# Patient Record
Sex: Female | Born: 1980 | Race: Black or African American | Hispanic: No | Marital: Single | Smoking: Never smoker
Health system: Southern US, Community
[De-identification: ages and names within clinical notes are randomized; demographics above are authoritative.]

## PROBLEM LIST (undated history)

## (undated) ENCOUNTER — Inpatient Hospital Stay (HOSPITAL_COMMUNITY): Payer: Self-pay

## (undated) DIAGNOSIS — M1990 Unspecified osteoarthritis, unspecified site: Secondary | ICD-10-CM

## (undated) DIAGNOSIS — E079 Disorder of thyroid, unspecified: Secondary | ICD-10-CM

## (undated) DIAGNOSIS — K219 Gastro-esophageal reflux disease without esophagitis: Secondary | ICD-10-CM

## (undated) DIAGNOSIS — O009 Unspecified ectopic pregnancy without intrauterine pregnancy: Secondary | ICD-10-CM

## (undated) DIAGNOSIS — I10 Essential (primary) hypertension: Secondary | ICD-10-CM

## (undated) HISTORY — DX: Gastro-esophageal reflux disease without esophagitis: K21.9

## (undated) HISTORY — PX: OTHER SURGICAL HISTORY: SHX169

## (undated) HISTORY — PX: LAPAROSCOPY: SHX197

---

## 2008-09-23 ENCOUNTER — Emergency Department (HOSPITAL_BASED_OUTPATIENT_CLINIC_OR_DEPARTMENT_OTHER): Admission: EM | Admit: 2008-09-23 | Discharge: 2008-09-23 | Payer: Self-pay | Admitting: Emergency Medicine

## 2009-01-23 ENCOUNTER — Emergency Department (HOSPITAL_BASED_OUTPATIENT_CLINIC_OR_DEPARTMENT_OTHER): Admission: EM | Admit: 2009-01-23 | Discharge: 2009-01-23 | Payer: Self-pay | Admitting: Emergency Medicine

## 2009-01-23 ENCOUNTER — Ambulatory Visit: Payer: Self-pay | Admitting: Diagnostic Radiology

## 2009-02-23 ENCOUNTER — Emergency Department (HOSPITAL_BASED_OUTPATIENT_CLINIC_OR_DEPARTMENT_OTHER): Admission: EM | Admit: 2009-02-23 | Discharge: 2009-02-23 | Payer: Self-pay | Admitting: Emergency Medicine

## 2009-03-03 ENCOUNTER — Emergency Department (HOSPITAL_BASED_OUTPATIENT_CLINIC_OR_DEPARTMENT_OTHER): Admission: EM | Admit: 2009-03-03 | Discharge: 2009-03-03 | Payer: Self-pay | Admitting: Emergency Medicine

## 2009-03-07 ENCOUNTER — Other Ambulatory Visit: Payer: Self-pay | Admitting: Emergency Medicine

## 2009-03-08 ENCOUNTER — Ambulatory Visit: Payer: Self-pay | Admitting: Advanced Practice Midwife

## 2009-03-08 ENCOUNTER — Inpatient Hospital Stay (HOSPITAL_COMMUNITY): Admission: AD | Admit: 2009-03-08 | Discharge: 2009-03-08 | Payer: Self-pay | Admitting: Obstetrics and Gynecology

## 2009-03-11 ENCOUNTER — Inpatient Hospital Stay (HOSPITAL_COMMUNITY): Admission: AD | Admit: 2009-03-11 | Discharge: 2009-03-11 | Payer: Self-pay | Admitting: Family Medicine

## 2009-03-14 ENCOUNTER — Inpatient Hospital Stay (HOSPITAL_COMMUNITY): Admission: AD | Admit: 2009-03-14 | Discharge: 2009-03-14 | Payer: Self-pay | Admitting: Obstetrics & Gynecology

## 2009-03-17 ENCOUNTER — Inpatient Hospital Stay (HOSPITAL_COMMUNITY): Admission: AD | Admit: 2009-03-17 | Discharge: 2009-03-17 | Payer: Self-pay | Admitting: Obstetrics and Gynecology

## 2009-03-17 ENCOUNTER — Ambulatory Visit: Payer: Self-pay | Admitting: Obstetrics and Gynecology

## 2009-03-21 ENCOUNTER — Inpatient Hospital Stay (HOSPITAL_COMMUNITY): Admission: AD | Admit: 2009-03-21 | Discharge: 2009-03-21 | Payer: Self-pay | Admitting: Obstetrics & Gynecology

## 2009-03-28 ENCOUNTER — Inpatient Hospital Stay (HOSPITAL_COMMUNITY): Admission: AD | Admit: 2009-03-28 | Discharge: 2009-03-28 | Payer: Self-pay | Admitting: Obstetrics and Gynecology

## 2009-04-11 ENCOUNTER — Inpatient Hospital Stay (HOSPITAL_COMMUNITY): Admission: AD | Admit: 2009-04-11 | Discharge: 2009-04-11 | Payer: Self-pay | Admitting: Obstetrics & Gynecology

## 2009-04-18 ENCOUNTER — Inpatient Hospital Stay (HOSPITAL_COMMUNITY): Admission: AD | Admit: 2009-04-18 | Discharge: 2009-04-18 | Payer: Self-pay | Admitting: Family Medicine

## 2009-06-08 ENCOUNTER — Emergency Department (HOSPITAL_BASED_OUTPATIENT_CLINIC_OR_DEPARTMENT_OTHER): Admission: EM | Admit: 2009-06-08 | Discharge: 2009-06-08 | Payer: Self-pay | Admitting: Emergency Medicine

## 2010-01-31 ENCOUNTER — Emergency Department (HOSPITAL_BASED_OUTPATIENT_CLINIC_OR_DEPARTMENT_OTHER)
Admission: EM | Admit: 2010-01-31 | Discharge: 2010-01-31 | Payer: Self-pay | Source: Home / Self Care | Admitting: Emergency Medicine

## 2010-03-04 ENCOUNTER — Emergency Department (HOSPITAL_BASED_OUTPATIENT_CLINIC_OR_DEPARTMENT_OTHER)
Admission: EM | Admit: 2010-03-04 | Discharge: 2010-03-05 | Payer: Self-pay | Source: Home / Self Care | Admitting: Emergency Medicine

## 2010-03-10 LAB — BASIC METABOLIC PANEL
BUN: 13 mg/dL (ref 6–23)
CO2: 26 mEq/L (ref 19–32)
Calcium: 9.7 mg/dL (ref 8.4–10.5)
Chloride: 105 mEq/L (ref 96–112)
Creatinine, Ser: 0.7 mg/dL (ref 0.4–1.2)
GFR calc Af Amer: >60 mL/min (ref >60)
GFR calc non Af Amer: >60 mL/min (ref >60)
Glucose, Bld: 101 mg/dL — ABNORMAL HIGH (ref 70–99)
Potassium: 3.8 mEq/L (ref 3.5–5.1)
Sodium: 143 mEq/L (ref 135–145)

## 2010-03-10 LAB — TSH: TSH: 2.474 u[IU]/mL (ref 0.350–4.500)

## 2010-03-10 LAB — HEMOCCULT GUIAC POC 1CARD (OFFICE): Fecal Occult Bld: NEGATIVE

## 2010-04-04 ENCOUNTER — Emergency Department (HOSPITAL_BASED_OUTPATIENT_CLINIC_OR_DEPARTMENT_OTHER)
Admission: EM | Admit: 2010-04-04 | Discharge: 2010-04-04 | Disposition: A | Discharge status: Home / Self Care | Payer: Self-pay | Attending: Emergency Medicine | Admitting: Emergency Medicine

## 2010-04-04 DIAGNOSIS — R350 Frequency of micturition: Secondary | ICD-10-CM | POA: Insufficient documentation

## 2010-04-04 DIAGNOSIS — I10 Essential (primary) hypertension: Secondary | ICD-10-CM | POA: Insufficient documentation

## 2010-04-04 DIAGNOSIS — N390 Urinary tract infection, site not specified: Secondary | ICD-10-CM | POA: Insufficient documentation

## 2010-04-04 LAB — URINALYSIS, ROUTINE W REFLEX MICROSCOPIC
Bilirubin Urine: NEGATIVE
Ketones, ur: NEGATIVE mg/dL
Nitrite: NEGATIVE
Protein, ur: NEGATIVE mg/dL
Specific Gravity, Urine: 1.009 (ref 1.005–1.030)
Urine Glucose, Fasting: NEGATIVE mg/dL
Urobilinogen, UA: 1 mg/dL (ref 0.0–1.0)
pH: 7 (ref 5.0–8.0)

## 2010-04-04 LAB — URINE MICROSCOPIC-ADD ON

## 2010-04-04 LAB — PREGNANCY, URINE: Preg Test, Ur: NEGATIVE

## 2010-05-06 LAB — URINALYSIS, ROUTINE W REFLEX MICROSCOPIC
Bilirubin Urine: NEGATIVE
Glucose, UA: NEGATIVE mg/dL
Hgb urine dipstick: NEGATIVE
Ketones, ur: NEGATIVE mg/dL
Nitrite: NEGATIVE
Protein, ur: NEGATIVE mg/dL
Specific Gravity, Urine: 1.016 (ref 1.005–1.030)
Urobilinogen, UA: 1 mg/dL (ref 0.0–1.0)
pH: 7.5 (ref 5.0–8.0)

## 2010-05-06 LAB — PREGNANCY, URINE: Preg Test, Ur: NEGATIVE

## 2010-05-11 LAB — BASIC METABOLIC PANEL
BUN: 8 mg/dL (ref 6–23)
BUN: 9 mg/dL (ref 6–23)
CO2: 27 mEq/L (ref 19–32)
CO2: 29 mEq/L (ref 19–32)
Calcium: 9 mg/dL (ref 8.4–10.5)
Calcium: 9 mg/dL (ref 8.4–10.5)
Chloride: 104 mEq/L (ref 96–112)
Chloride: 107 mEq/L (ref 96–112)
Creatinine, Ser: 0.7 mg/dL (ref 0.4–1.2)
Creatinine, Ser: 0.7 mg/dL (ref 0.4–1.2)
GFR calc Af Amer: >60 mL/min (ref >60)
GFR calc Af Amer: >60 mL/min (ref >60)
GFR calc non Af Amer: >60 mL/min (ref >60)
GFR calc non Af Amer: >60 mL/min (ref >60)
Glucose, Bld: 84 mg/dL (ref 70–99)
Glucose, Bld: 91 mg/dL (ref 70–99)
Potassium: 3.4 mEq/L — ABNORMAL LOW (ref 3.5–5.1)
Potassium: 4.5 mEq/L (ref 3.5–5.1)
Sodium: 143 mEq/L (ref 135–145)
Sodium: 143 mEq/L (ref 135–145)

## 2010-05-11 LAB — CBC
HCT: 31.3 % — ABNORMAL LOW (ref 36.0–46.0)
HCT: 32.7 % — ABNORMAL LOW (ref 36.0–46.0)
Hemoglobin: 10.8 g/dL — ABNORMAL LOW (ref 12.0–15.0)
Hemoglobin: 10.8 g/dL — ABNORMAL LOW (ref 12.0–15.0)
MCHC: 32.9 g/dL (ref 30.0–36.0)
MCHC: 34.6 g/dL (ref 30.0–36.0)
MCV: 88.6 fL (ref 78.0–100.0)
MCV: 89.3 fL (ref 78.0–100.0)
Platelets: 311 10*3/uL (ref 150–400)
Platelets: 328 10*3/uL (ref 150–400)
RBC: 3.53 MIL/uL — ABNORMAL LOW (ref 3.87–5.11)
RBC: 3.66 MIL/uL — ABNORMAL LOW (ref 3.87–5.11)
RDW: 12.5 % (ref 11.5–15.5)
RDW: 12.8 % (ref 11.5–15.5)
WBC: 4.8 10*3/uL (ref 4.0–10.5)
WBC: 7.7 10*3/uL (ref 4.0–10.5)

## 2010-05-11 LAB — COMPREHENSIVE METABOLIC PANEL
ALT: 12 U/L (ref 0–35)
AST: 17 U/L (ref 0–37)
CO2: 24 mEq/L (ref 19–32)
Calcium: 8.9 mg/dL (ref 8.4–10.5)
Chloride: 103 mEq/L (ref 96–112)
Creatinine, Ser: 0.5 mg/dL (ref 0.4–1.2)
GFR calc non Af Amer: >60 mL/min (ref >60)
Glucose, Bld: 89 mg/dL (ref 70–99)
Sodium: 133 mEq/L — ABNORMAL LOW (ref 135–145)
Total Bilirubin: 0.7 mg/dL (ref 0.3–1.2)

## 2010-05-11 LAB — GC/CHLAMYDIA PROBE AMP, GENITAL
Chlamydia, DNA Probe: NEGATIVE
GC Probe Amp, Genital: NEGATIVE

## 2010-05-11 LAB — URINALYSIS, ROUTINE W REFLEX MICROSCOPIC
Bilirubin Urine: NEGATIVE
Bilirubin Urine: NEGATIVE
Glucose, UA: NEGATIVE mg/dL
Glucose, UA: NEGATIVE mg/dL
Hgb urine dipstick: NEGATIVE
Ketones, ur: 15 mg/dL — AB
Ketones, ur: NEGATIVE mg/dL
Nitrite: NEGATIVE
Nitrite: NEGATIVE
Protein, ur: NEGATIVE mg/dL
Protein, ur: NEGATIVE mg/dL
Specific Gravity, Urine: 1.024 (ref 1.005–1.030)
Specific Gravity, Urine: 1.026 (ref 1.005–1.030)
Urobilinogen, UA: 1 mg/dL (ref 0.0–1.0)
Urobilinogen, UA: 4 mg/dL — ABNORMAL HIGH (ref 0.0–1.0)
pH: 6 (ref 5.0–8.0)
pH: 7 (ref 5.0–8.0)

## 2010-05-11 LAB — DIFFERENTIAL
Basophils Absolute: 0.1 10*3/uL (ref 0.0–0.1)
Basophils Absolute: 0.2 10*3/uL — ABNORMAL HIGH (ref 0.0–0.1)
Basophils Relative: 2 % — ABNORMAL HIGH (ref 0–1)
Basophils Relative: 2 % — ABNORMAL HIGH (ref 0–1)
Eosinophils Absolute: 0 10*3/uL (ref 0.0–0.7)
Eosinophils Absolute: 0 10*3/uL (ref 0.0–0.7)
Eosinophils Relative: 0 % (ref 0–5)
Eosinophils Relative: 1 % (ref 0–5)
Lymphocytes Relative: 19 % (ref 12–46)
Lymphocytes Relative: 23 % (ref 12–46)
Lymphs Abs: 1.1 10*3/uL (ref 0.7–4.0)
Lymphs Abs: 1.5 10*3/uL (ref 0.7–4.0)
Monocytes Absolute: 0.2 10*3/uL (ref 0.1–1.0)
Monocytes Absolute: 0.3 10*3/uL (ref 0.1–1.0)
Monocytes Relative: 4 % (ref 3–12)
Monocytes Relative: 4 % (ref 3–12)
Neutro Abs: 3.4 10*3/uL (ref 1.7–7.7)
Neutro Abs: 5.7 10*3/uL (ref 1.7–7.7)
Neutrophils Relative %: 71 % (ref 43–77)
Neutrophils Relative %: 74 % (ref 43–77)

## 2010-05-11 LAB — URINE MICROSCOPIC-ADD ON

## 2010-05-11 LAB — WET PREP, GENITAL
Trich, Wet Prep: NONE SEEN
Yeast Wet Prep HPF POC: NONE SEEN

## 2010-05-11 LAB — PREGNANCY, URINE
Preg Test, Ur: POSITIVE
Preg Test, Ur: POSITIVE

## 2010-05-11 LAB — HCG, QUANTITATIVE, PREGNANCY: hCG, Beta Chain, Quant, S: 8061 m[IU]/mL — ABNORMAL HIGH (ref <5)

## 2010-05-12 LAB — CREATININE, SERUM
Creatinine, Ser: 0.6 mg/dL (ref 0.4–1.2)
GFR calc Af Amer: >60 mL/min (ref >60)

## 2010-05-12 LAB — DIFFERENTIAL
Basophils Absolute: 0 10*3/uL (ref 0.0–0.1)
Basophils Relative: 0 % (ref 0–1)
Eosinophils Absolute: 0.1 10*3/uL (ref 0.0–0.7)
Monocytes Absolute: 0.3 10*3/uL (ref 0.1–1.0)
Neutro Abs: 5.3 10*3/uL (ref 1.7–7.7)
Neutrophils Relative %: 81 % — ABNORMAL HIGH (ref 43–77)

## 2010-05-12 LAB — HCG, QUANTITATIVE, PREGNANCY: hCG, Beta Chain, Quant, S: 23424 m[IU]/mL — ABNORMAL HIGH (ref <5)

## 2010-05-12 LAB — CBC
Hemoglobin: 11 g/dL — ABNORMAL LOW (ref 12.0–15.0)
MCHC: 34.2 g/dL (ref 30.0–36.0)
MCV: 89 fL (ref 78.0–100.0)
RDW: 13.1 % (ref 11.5–15.5)

## 2010-05-15 LAB — HCG, QUANTITATIVE, PREGNANCY: hCG, Beta Chain, Quant, S: 3061 m[IU]/mL — ABNORMAL HIGH (ref <5)

## 2010-06-01 LAB — CBC
HCT: 35.3 % — ABNORMAL LOW (ref 36.0–46.0)
MCHC: 33.3 g/dL (ref 30.0–36.0)
MCV: 88.2 fL (ref 78.0–100.0)
Platelets: 396 10*3/uL (ref 150–400)

## 2010-06-01 LAB — DIFFERENTIAL
Basophils Relative: 1 % (ref 0–1)
Eosinophils Absolute: 0 10*3/uL (ref 0.0–0.7)
Eosinophils Relative: 0 % (ref 0–5)
Lymphs Abs: 1.3 10*3/uL (ref 0.7–4.0)
Neutrophils Relative %: 75 % (ref 43–77)

## 2010-06-01 LAB — URINALYSIS, ROUTINE W REFLEX MICROSCOPIC
Bilirubin Urine: NEGATIVE
Nitrite: NEGATIVE
Specific Gravity, Urine: 1.025 (ref 1.005–1.030)
Urobilinogen, UA: 4 mg/dL — ABNORMAL HIGH (ref 0.0–1.0)
pH: 6.5 (ref 5.0–8.0)

## 2010-06-01 LAB — WET PREP, GENITAL: Yeast Wet Prep HPF POC: NONE SEEN

## 2010-06-01 LAB — URINE MICROSCOPIC-ADD ON

## 2010-06-01 LAB — BASIC METABOLIC PANEL
BUN: 8 mg/dL (ref 6–23)
CO2: 29 mEq/L (ref 19–32)
Chloride: 104 mEq/L (ref 96–112)
Creatinine, Ser: 0.7 mg/dL (ref 0.4–1.2)
Glucose, Bld: 93 mg/dL (ref 70–99)
Potassium: 3.5 mEq/L (ref 3.5–5.1)

## 2010-06-01 LAB — HCG, QUANTITATIVE, PREGNANCY: hCG, Beta Chain, Quant, S: 162 m[IU]/mL — ABNORMAL HIGH (ref <5)

## 2010-06-01 LAB — ABO/RH: ABO/RH(D): A POS

## 2010-06-01 LAB — GC/CHLAMYDIA PROBE AMP, GENITAL: GC Probe Amp, Genital: NEGATIVE

## 2010-06-01 LAB — PREGNANCY, URINE: Preg Test, Ur: POSITIVE

## 2011-02-01 ENCOUNTER — Emergency Department (HOSPITAL_BASED_OUTPATIENT_CLINIC_OR_DEPARTMENT_OTHER)
Admission: EM | Admit: 2011-02-01 | Discharge: 2011-02-01 | Disposition: A | Discharge status: Home / Self Care | Payer: No Typology Code available for payment source | Attending: Emergency Medicine | Admitting: Emergency Medicine

## 2011-02-01 ENCOUNTER — Encounter: Payer: Self-pay | Admitting: *Deleted

## 2011-02-01 DIAGNOSIS — R51 Headache: Secondary | ICD-10-CM

## 2011-02-01 DIAGNOSIS — Y92410 Unspecified street and highway as the place of occurrence of the external cause: Secondary | ICD-10-CM | POA: Insufficient documentation

## 2011-02-01 DIAGNOSIS — S0990XA Unspecified injury of head, initial encounter: Secondary | ICD-10-CM

## 2011-02-01 DIAGNOSIS — S060XAA Concussion with loss of consciousness status unknown, initial encounter: Secondary | ICD-10-CM | POA: Insufficient documentation

## 2011-02-01 DIAGNOSIS — S060X9A Concussion with loss of consciousness of unspecified duration, initial encounter: Secondary | ICD-10-CM | POA: Insufficient documentation

## 2011-02-01 MED ORDER — HYDROCODONE-ACETAMINOPHEN 5-325 MG PO TABS: 2.0000 | ORAL_TABLET | ORAL | Status: AC | PRN

## 2011-02-01 MED ORDER — IBUPROFEN 800 MG PO TABS: 800.0000 mg | ORAL_TABLET | Freq: Three times a day (TID) | ORAL | Status: AC | PRN
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
## 2011-08-01 ENCOUNTER — Emergency Department (HOSPITAL_BASED_OUTPATIENT_CLINIC_OR_DEPARTMENT_OTHER): Payer: Self-pay

## 2011-08-01 ENCOUNTER — Emergency Department (HOSPITAL_BASED_OUTPATIENT_CLINIC_OR_DEPARTMENT_OTHER)
Admission: EM | Admit: 2011-08-01 | Discharge: 2011-08-01 | Disposition: A | Discharge status: Home / Self Care | Payer: Self-pay | Attending: Emergency Medicine | Admitting: Emergency Medicine

## 2011-08-01 ENCOUNTER — Encounter (HOSPITAL_BASED_OUTPATIENT_CLINIC_OR_DEPARTMENT_OTHER): Payer: Self-pay | Admitting: Emergency Medicine

## 2011-08-01 DIAGNOSIS — I10 Essential (primary) hypertension: Secondary | ICD-10-CM | POA: Insufficient documentation

## 2011-08-01 DIAGNOSIS — M79609 Pain in unspecified limb: Secondary | ICD-10-CM | POA: Insufficient documentation

## 2011-08-01 DIAGNOSIS — IMO0002 Reserved for concepts with insufficient information to code with codable children: Secondary | ICD-10-CM

## 2011-08-01 HISTORY — DX: Essential (primary) hypertension: I10

## 2011-08-01 MED ORDER — HYDROCHLOROTHIAZIDE 25 MG PO TABS: 25.0000 mg | ORAL_TABLET | Freq: Every day | ORAL | Status: DC

## 2011-08-01 NOTE — ED Provider Notes (Signed)
History     CSN: [id number]  Arrival date & time 08/01/11  4098   First MD Initiated Contact with Patient 08/01/11 1000      Chief Complaint  Patient presents with  . Foot Pain    (Consider location/radiation/quality/duration/timing/severity/associated sxs/prior treatment) Patient is a 31 y.o. female presenting with lower extremity pain. The history is provided by the patient.  Foot Pain This is a new problem. Episode onset: one month ago. Episode frequency: intermittently. The problem has been gradually worsening. Pertinent negatives include no shortness of breath. Exacerbated by: being on feet for a while, walking. The symptoms are relieved by nothing. She has tried nothing for the symptoms.    Past Medical History  Diagnosis Date  . Hypertension     History reviewed. No pertinent past surgical history.  No family history on file.  History  Substance Use Topics  . Smoking status: Never Smoker   . Smokeless tobacco: Not on file  . Alcohol Use: Yes    OB History    Grav Para Term Preterm Abortions TAB SAB Ect Mult Living                  Review of Systems  Respiratory: Negative for shortness of breath.   All other systems reviewed and are negative.    Allergies  Review of patient's allergies indicates no known allergies.  Home Medications   Current Outpatient Rx  Name Route Sig Dispense Refill  . NIFEDIPINE ER 60 MG PO TB24 Oral Take 60 mg by mouth daily.      BP 163/105  Pulse 84  Temp(Src) 98.3 F (36.8 C) (Oral)  Resp 16  SpO2 98%  Physical Exam  Nursing note and vitals reviewed. Constitutional: She is oriented to person, place, and time. She appears well-developed and well-nourished.  HENT:  Head: Normocephalic and atraumatic.  Neck: Normal range of motion. Neck supple.  Musculoskeletal:       The left foot appears grossly normal.  I don't appreciate any swelling.  The dp and pt pulses are strong.  There is no calf ttp and Homan's sign is  absent.  Neurological: She is alert and oriented to person, place, and time.  Skin: Skin is warm and dry.    ED Course  Procedures (including critical care time)  Labs Reviewed - No data to display No results found.   No diagnosis found.    MDM  The complaints sound like dependent edema and there is no evidence of fracture or other abnormality on the xray.  She will be discharged to home with a prescription for hctz to fill if this is an ongoing problem.        Geoffery Lyons, MD 08/01/11 [phone]

## 2011-08-01 NOTE — ED Notes (Signed)
Pt c/o pain to top of LT foot x 1 mo- no known injury

## 2013-03-12 ENCOUNTER — Encounter (HOSPITAL_BASED_OUTPATIENT_CLINIC_OR_DEPARTMENT_OTHER): Payer: Self-pay | Admitting: Emergency Medicine

## 2013-03-12 ENCOUNTER — Emergency Department (HOSPITAL_BASED_OUTPATIENT_CLINIC_OR_DEPARTMENT_OTHER)
Admission: EM | Admit: 2013-03-12 | Discharge: 2013-03-12 | Disposition: A | Discharge status: Home / Self Care | Payer: Self-pay | Attending: Emergency Medicine | Admitting: Emergency Medicine

## 2013-03-12 ENCOUNTER — Emergency Department (HOSPITAL_BASED_OUTPATIENT_CLINIC_OR_DEPARTMENT_OTHER): Payer: Self-pay

## 2013-03-12 DIAGNOSIS — R519 Headache, unspecified: Secondary | ICD-10-CM

## 2013-03-12 DIAGNOSIS — R51 Headache: Secondary | ICD-10-CM | POA: Insufficient documentation

## 2013-03-12 DIAGNOSIS — H53149 Visual discomfort, unspecified: Secondary | ICD-10-CM | POA: Insufficient documentation

## 2013-03-12 DIAGNOSIS — I10 Essential (primary) hypertension: Secondary | ICD-10-CM | POA: Insufficient documentation

## 2013-03-12 MED ORDER — METOCLOPRAMIDE HCL 5 MG/ML IJ SOLN
10.0000 mg | Freq: Once | INTRAMUSCULAR | Status: AC
  Administered 2013-03-12: 10 mg via INTRAVENOUS
  Filled 2013-03-12: qty 2

## 2013-03-12 MED ORDER — DIPHENHYDRAMINE HCL 50 MG/ML IJ SOLN
25.0000 mg | Freq: Once | INTRAMUSCULAR | Status: AC
  Administered 2013-03-12: 25 mg via INTRAVENOUS
  Filled 2013-03-12: qty 1

## 2013-03-12 MED ORDER — DEXAMETHASONE SODIUM PHOSPHATE 10 MG/ML IJ SOLN
10.0000 mg | Freq: Once | INTRAMUSCULAR | Status: AC
  Administered 2013-03-12: 10 mg via INTRAVENOUS
  Filled 2013-03-12: qty 1

## 2013-03-12 MED ORDER — SODIUM CHLORIDE 0.9 % IV SOLN
INTRAVENOUS | Status: DC
  Administered 2013-03-12: 20:00:00 via INTRAVENOUS

## 2013-03-12 NOTE — ED Provider Notes (Signed)
CSN: [id number]     Arrival date & time 03/12/13  1737 History   First MD Initiated Contact with Patient 03/12/13 1943     Chief Complaint  Patient presents with  . Headache   (Consider location/radiation/quality/duration/timing/severity/associated sxs/prior Treatment) Patient is a 33 y.o. female presenting with headaches. The history is provided by the patient.  Headache Pain location:  Frontal Quality:  Sharp Radiates to:  Does not radiate Severity currently:  8/10 Severity at highest:  10/10 Onset quality:  Sudden Duration:  12 hours Timing:  Constant Progression:  Improving Chronicity:  New Similar to prior headaches: no   Relieved by:  Nothing Worsened by:  Light and sound Ineffective treatments:  NSAIDs Associated symptoms: photophobia   Associated symptoms: no abdominal pain, no back pain, no blurred vision, no congestion, no cough, no diarrhea, no dizziness, no ear pain, no pain, no facial pain, no fever, no focal weakness, no hearing loss, no loss of balance, no myalgias, no nausea, no numbness, no seizures, no sinus pressure, no sore throat, no syncope, no tingling, no URI, no visual change, no vomiting and no weakness   Risk factors: insomnia    Velta Rockholt is a 33 y.o. female who presents to the ED with a headache that started when she woke this morning at 8 o'clock. She has had headaches before but never like this. She does have hx of high blood pressure and has been out of her medication x 1 year.   Past Medical History  Diagnosis Date  . Hypertension    History reviewed. No pertinent past surgical history. No family history on file. History  Substance Use Topics  . Smoking status: Never Smoker   . Smokeless tobacco: Not on file  . Alcohol Use: Yes   OB History   Grav Para Term Preterm Abortions TAB SAB Ect Mult Living                 Review of Systems  Constitutional: Negative for fever.  HENT: Negative for congestion, ear pain, hearing loss,  sinus pressure and sore throat.   Eyes: Positive for photophobia. Negative for blurred vision and pain.  Respiratory: Negative for cough and shortness of breath.   Cardiovascular: Negative for chest pain and syncope.  Gastrointestinal: Negative for nausea, vomiting, abdominal pain and diarrhea.  Genitourinary: Negative for dysuria, urgency and frequency.  Musculoskeletal: Negative for back pain and myalgias.  Skin: Negative for rash.  Neurological: Positive for headaches. Negative for dizziness, focal weakness, seizures, numbness and loss of balance.  Psychiatric/Behavioral: Negative for confusion. The patient is not nervous/anxious.     Allergies  Review of patient's allergies indicates no known allergies.  Home Medications  No current outpatient prescriptions on file. BP 172/99  Pulse 72  Temp(Src) 98.7 F (37.1 C)  Resp 18  Wt 255 lb (115.667 kg)  SpO2 100% Physical Exam  Nursing note and vitals reviewed. Constitutional: She is oriented to person, place, and time. She appears well-developed and well-nourished. No distress.  HENT:  Head: Normocephalic and atraumatic.  Right Ear: Tympanic membrane normal.  Left Ear: Tympanic membrane normal.  Nose: Nose normal.  Mouth/Throat: Uvula is midline, oropharynx is clear and moist and mucous membranes are normal.  Eyes: Conjunctivae and EOM are normal. Right eye exhibits no nystagmus. Left eye exhibits no nystagmus.  Neck: Trachea normal and normal range of motion. Neck supple. Muscular tenderness present. No spinous process tenderness present. Normal range of motion present.  No meningeal signs  Cardiovascular: Normal rate, regular rhythm and normal heart sounds.   Pulmonary/Chest: Effort normal. She has no wheezes. She has no rales.  Abdominal: Soft. Bowel sounds are normal. There is no tenderness.  Musculoskeletal: Normal range of motion.  Neurological: She is alert and oriented to person, place, and time. She has normal strength  and normal reflexes. No cranial nerve deficit or sensory deficit. She displays a negative Romberg sign. Coordination and gait normal.  Stands on one foot without difficulty. Steady gait, rapid alternating movement without difficulty.  Skin: Skin is warm and dry.  Psychiatric: She has a normal mood and affect. Her behavior is normal. Judgment and thought content normal.   Ct Head Wo Contrast  03/12/2013   CLINICAL DATA:  HEADACHE, worst ever  EXAM: CT HEAD WITHOUT CONTRAST  TECHNIQUE: Contiguous axial images were obtained from the base of the skull through the vertex without intravenous contrast.  COMPARISON:  CT HEAD W/O CM dated 01/23/2009  FINDINGS: No acute intracranial hemorrhage. No focal mass lesion. No CT evidence of acute infarction. No midline shift or mass effect. No hydrocephalus. Basilar cisterns are patent. Paranasal sinuses and mastoid air cells are clear.  IMPRESSION: Normal head CT   Electronically Signed   By: Suzy Bouchard M.D.   On: 03/12/2013 20:52    ED Course: IV hydration, Decadron 10 mg IV, Benadryl 25 mg IV and Reglan 10 mg IV  Procedures MDM: after medications and IV fluids patient is without pain   33 y.o. female with frontal headache that was relieved with medication and IV fluids. I have reviewed this patient's vital signs, nurses notes, appropriate labs and imaging.  I have discussed findings with the patient and plan of care. She voices understanding. She is stable for discharge without headache, afebrile and without neck pain.  I have reviewed this patient's vital signs, nurses notes, appropriate imaging and discussed findings and plan of care with the patient. She voices understanding.  Stressed importance of follow up with PCP for BP evaluation and routine care.   [place], [place] 03/14/13 [phone]

## 2013-03-12 NOTE — Discharge Instructions (Signed)
Be sure you are drinking plenty of fluids. Follow up with your doctor and return here as needed for problems.

## 2013-03-12 NOTE — ED Notes (Signed)
D/c home with family to drive. Advised to follow up with her doctor regarding bp

## 2013-03-12 NOTE — ED Notes (Signed)
Patient here with frontal headache and neck pain since awakening this am, took ibuprofen with no relief. Denies trauma

## 2013-03-21 NOTE — ED Provider Notes (Signed)
History/physical exam/procedure(s) were performed by non-physician practitioner and as supervising physician I was immediately available for consultation/collaboration. I have reviewed all notes and am in agreement with care and plan.   Shaune Pollack, MD 03/21/13 1520

## 2014-03-20 ENCOUNTER — Emergency Department (HOSPITAL_BASED_OUTPATIENT_CLINIC_OR_DEPARTMENT_OTHER)
Admission: EM | Admit: 2014-03-20 | Discharge: 2014-03-20 | Disposition: A | Discharge status: Home / Self Care | Payer: PRIVATE HEALTH INSURANCE | Attending: Emergency Medicine | Admitting: Emergency Medicine

## 2014-03-20 ENCOUNTER — Encounter (HOSPITAL_BASED_OUTPATIENT_CLINIC_OR_DEPARTMENT_OTHER): Payer: Self-pay | Admitting: *Deleted

## 2014-03-20 ENCOUNTER — Emergency Department (HOSPITAL_BASED_OUTPATIENT_CLINIC_OR_DEPARTMENT_OTHER): Payer: PRIVATE HEALTH INSURANCE

## 2014-03-20 DIAGNOSIS — R519 Headache, unspecified: Secondary | ICD-10-CM

## 2014-03-20 DIAGNOSIS — R51 Headache: Secondary | ICD-10-CM | POA: Insufficient documentation

## 2014-03-20 DIAGNOSIS — R110 Nausea: Secondary | ICD-10-CM | POA: Diagnosis not present

## 2014-03-20 DIAGNOSIS — I10 Essential (primary) hypertension: Secondary | ICD-10-CM | POA: Insufficient documentation

## 2014-03-20 LAB — CBC WITH DIFFERENTIAL/PLATELET
BASOS PCT: 0 % (ref 0–1)
Basophils Absolute: 0 10*3/uL (ref 0.0–0.1)
EOS PCT: 0 % (ref 0–5)
Eosinophils Absolute: 0 10*3/uL (ref 0.0–0.7)
HEMATOCRIT: 35.5 % — AB (ref 36.0–46.0)
Hemoglobin: 11.5 g/dL — ABNORMAL LOW (ref 12.0–15.0)
Lymphocytes Relative: 15 % (ref 12–46)
Lymphs Abs: 1.1 10*3/uL (ref 0.7–4.0)
MCH: 29 pg (ref 26.0–34.0)
MCHC: 32.4 g/dL (ref 30.0–36.0)
MCV: 89.4 fL (ref 78.0–100.0)
MONO ABS: 0.3 10*3/uL (ref 0.1–1.0)
MONOS PCT: 3 % (ref 3–12)
NEUTROS ABS: 5.9 10*3/uL (ref 1.7–7.7)
NEUTROS PCT: 82 % — AB (ref 43–77)
Platelets: 334 10*3/uL (ref 150–400)
RBC: 3.97 MIL/uL (ref 3.87–5.11)
RDW: 13 % (ref 11.5–15.5)
WBC: 7.3 10*3/uL (ref 4.0–10.5)

## 2014-03-20 LAB — BASIC METABOLIC PANEL
ANION GAP: 4 — AB (ref 5–15)
BUN: 9 mg/dL (ref 6–23)
CO2: 26 mmol/L (ref 19–32)
Calcium: 8.7 mg/dL (ref 8.4–10.5)
Chloride: 105 mmol/L (ref 96–112)
Creatinine, Ser: 0.68 mg/dL (ref 0.50–1.10)
GFR calc Af Amer: >90 mL/min (ref >90)
GFR calc non Af Amer: >90 mL/min (ref >90)
Glucose, Bld: 105 mg/dL — ABNORMAL HIGH (ref 70–99)
Potassium: 3.2 mmol/L — ABNORMAL LOW (ref 3.5–5.1)
SODIUM: 135 mmol/L (ref 135–145)

## 2014-03-20 MED ORDER — HYDROCODONE-ACETAMINOPHEN 5-325 MG PO TABS: 1.0000 | ORAL_TABLET | Freq: Once | ORAL | Status: DC

## 2014-03-20 MED ORDER — ONDANSETRON 4 MG PO TBDP: 4.0000 mg | ORAL_TABLET | Freq: Once | ORAL | Status: DC

## 2014-03-20 MED ORDER — LISINOPRIL 20 MG PO TABS: 20.0000 mg | ORAL_TABLET | Freq: Every day | ORAL | Status: DC

## 2014-03-20 MED ORDER — LISINOPRIL 10 MG PO TABS
20.0000 mg | ORAL_TABLET | Freq: Once | ORAL | Status: AC
  Administered 2014-03-20: 20 mg via ORAL
  Filled 2014-03-20: qty 2

## 2014-03-20 MED ORDER — HYDROCODONE-ACETAMINOPHEN 5-325 MG PO TABS
1.0000 | ORAL_TABLET | Freq: Four times a day (QID) | ORAL | Status: DC | PRN
Start: 2014-03-20 — End: 2014-10-01

## 2014-03-20 NOTE — ED Notes (Addendum)
Pt c/o increased BP and h/a x 1 day seen here 1/18 for same Pt is out of lisinopril 20mg  x 3 months

## 2014-03-20 NOTE — ED Provider Notes (Signed)
CSN: [id number]     Arrival date & time 03/20/14  2127 History  This chart was scribed for Fredia Sorrow, MD by Chester Holstein, ED Scribe. This patient was seen in room MH01/[facility]01 and the patient's care was started at 10:08 PM.    Chief Complaint  Patient presents with  . Hypertension    Patient is a 34 y.o. female presenting with hypertension. The history is provided by the patient. No language interpreter was used.  Hypertension Associated symptoms include headaches. Pertinent negatives include no chest pain, no abdominal pain and no shortness of breath.    HPI Comments: Kimberli Winne is a 34 y.o. female with PMHx of HTN who presents to the Emergency Department complaining of elevated BP with onset today.  Pt notes associated nausea and improving 9/10 right frontal headache this morning. Pt has not taken any medication for relief.  Pt notes she has been out of her lisinopril for 3-4 months. Pt denies vomiting and fever.   Past Medical History  Diagnosis Date  . Hypertension    History reviewed. No pertinent past surgical history. History reviewed. No pertinent family history. History  Substance Use Topics  . Smoking status: Never Smoker   . Smokeless tobacco: Not on file  . Alcohol Use: Yes   OB History    No data available     Review of Systems  Constitutional: Negative for fever and chills.  HENT: Negative for rhinorrhea and sore throat.   Eyes: Negative for visual disturbance.  Respiratory: Negative for cough and shortness of breath.   Cardiovascular: Negative for chest pain and leg swelling.  Gastrointestinal: Positive for nausea. Negative for vomiting, abdominal pain and diarrhea.  Genitourinary: Negative for dysuria.  Musculoskeletal: Negative for back pain.  Skin: Negative for rash.  Neurological: Positive for headaches.  Hematological: Does not bruise/bleed easily.  Psychiatric/Behavioral: Negative for confusion.      Allergies  Review of patient's  allergies indicates no known allergies.  Home Medications   Prior to Admission medications   Medication Sig Start Date End Date Taking? Authorizing Provider  HYDROcodone-acetaminophen (NORCO/VICODIN) 5-325 MG per tablet Take 1-2 tablets by mouth every 6 (six) hours as needed for moderate pain. 03/20/14   Fredia Sorrow, MD  lisinopril (PRINIVIL,ZESTRIL) 20 MG tablet Take 1 tablet (20 mg total) by mouth daily. 03/20/14   Fredia Sorrow, MD   BP 150/124 mmHg  Pulse 71  Temp(Src) 98 F (36.7 C) (Oral)  Resp 16  Ht 5\' 2"  (1.575 m)  Wt 255 lb (115.667 kg)  BMI 46.63 kg/m2  SpO2 100%  LMP 02/27/2014 Physical Exam  Constitutional: She is oriented to person, place, and time. She appears well-developed and well-nourished.  HENT:  Head: Normocephalic.  Mouth/Throat: Oropharynx is clear and moist.  Eyes: Conjunctivae and EOM are normal. Pupils are equal, round, and reactive to light. No scleral icterus.  Neck: Normal range of motion. Neck supple.  Cardiovascular: Normal rate, regular rhythm and normal heart sounds.  Exam reveals no friction rub.   No murmur heard. Pulmonary/Chest: Effort normal and breath sounds normal. No respiratory distress. She has no wheezes. She has no rales.  Abdominal: Soft. Bowel sounds are normal. She exhibits no distension. There is no tenderness. There is no rebound and no guarding.  Musculoskeletal: Normal range of motion. She exhibits no edema.  Neurological: She is alert and oriented to person, place, and time. No cranial nerve deficit. She exhibits normal muscle tone. Coordination normal.  Skin: Skin is warm and  dry.  Psychiatric: She has a normal mood and affect. Her behavior is normal.  Nursing note and vitals reviewed.   ED Course  Procedures (including critical care time) DIAGNOSTIC STUDIES: Oxygen Saturation is 100% on room air, normal by my interpretation.    COORDINATION OF CARE: 10:12 PM Discussed treatment plan with patient at beside, the  patient agrees with the plan and has no further questions at this time.   Labs Review Labs Reviewed  BASIC METABOLIC PANEL - Abnormal; Notable for the following:    Potassium 3.2 (*)    Glucose, Bld 105 (*)    Anion gap 4 (*)    All other components within normal limits  CBC WITH DIFFERENTIAL/PLATELET - Abnormal; Notable for the following:    Hemoglobin 11.5 (*)    HCT 35.5 (*)    Neutrophils Relative % 82 (*)    All other components within normal limits   Results for orders placed or performed during the hospital encounter of 02/24/70  Basic metabolic panel  Result Value Ref Range   Sodium 135 135 - 145 mmol/L   Potassium 3.2 (L) 3.5 - 5.1 mmol/L   Chloride 105 96 - 112 mmol/L   CO2 26 19 - 32 mmol/L   Glucose, Bld 105 (H) 70 - 99 mg/dL   BUN 9 6 - 23 mg/dL   Creatinine, Ser 0.68 0.50 - 1.10 mg/dL   Calcium 8.7 8.4 - 10.5 mg/dL   GFR calc non Af Amer >90 >90 mL/min   GFR calc Af Amer >90 >90 mL/min   Anion gap 4 (L) 5 - 15  CBC with Differential/Platelet  Result Value Ref Range   WBC 7.3 4.0 - 10.5 K/uL   RBC 3.97 3.87 - 5.11 MIL/uL   Hemoglobin 11.5 (L) 12.0 - 15.0 g/dL   HCT 35.5 (L) 36.0 - 46.0 %   MCV 89.4 78.0 - 100.0 fL   MCH 29.0 26.0 - 34.0 pg   MCHC 32.4 30.0 - 36.0 g/dL   RDW 13.0 11.5 - 15.5 %   Platelets 334 150 - 400 K/uL   Neutrophils Relative % 82 (H) 43 - 77 %   Neutro Abs 5.9 1.7 - 7.7 K/uL   Lymphocytes Relative 15 12 - 46 %   Lymphs Abs 1.1 0.7 - 4.0 K/uL   Monocytes Relative 3 3 - 12 %   Monocytes Absolute 0.3 0.1 - 1.0 K/uL   Eosinophils Relative 0 0 - 5 %   Eosinophils Absolute 0.0 0.0 - 0.7 K/uL   Basophils Relative 0 0 - 1 %   Basophils Absolute 0.0 0.0 - 0.1 K/uL     Imaging Review Ct Head Wo Contrast  03/20/2014   CLINICAL DATA:  Headache for 1 day  EXAM: CT HEAD WITHOUT CONTRAST  TECHNIQUE: Contiguous axial images were obtained from the base of the skull through the vertex without intravenous contrast.  COMPARISON:  03/12/2013   FINDINGS: No skull fracture is noted. Paranasal sinuses and mastoid air cells are unremarkable.  No acute cortical infarction. No mass lesion is noted on this unenhanced scan. No hydrocephalus. The gray and white-matter differentiation is preserved. No intra or extra-axial fluid collection.  IMPRESSION: No acute intracranial abnormality.  No significant change.   Electronically Signed   By: Lahoma Crocker M.D.   On: 03/20/2014 22:50     EKG Interpretation None      MDM   Final diagnoses:  Headache  Essential hypertension   Patient with history of  hypertension been out of meds for about 3 months. Lab workup here done mild hypokalemia. Patient restarted on her lisinopril first dose given here tonight. Patient with mild headache head CT negative for any evidence of a bleed. Patient does have the ability to have follow-up also given resource guide.  Patient without any neuro focal deficits patient without any shortness of breath chest pain.  I personally performed the services described in this documentation, which was scribed in my presence. The recorded information has been reviewed and is accurate.      Fredia Sorrow, MD 03/20/14 2340

## 2014-03-20 NOTE — Discharge Instructions (Signed)
Resource guide provided below to help you find a regular Dr. for further treatment of your blood pressure. Need to have blood pressure recheck to make sure medicine is helping sometime in the next week. Take pain medicine as needed for headache. Return for any new or worse symptoms. Lisinopril prescription renewed.   Emergency Department Resource Guide 1) Find a Doctor and Pay Out of Pocket Although you won't have to find out who is covered by your insurance plan, it is a good idea to ask around and get recommendations. You will then need to call the office and see if the doctor you have chosen will accept you as a new patient and what types of options they offer for patients who are self-pay. Some doctors offer discounts or will set up payment plans for their patients who do not have insurance, but you will need to ask so you aren't surprised when you get to your appointment.  2) Contact Your Local Health Department Not all health departments have doctors that can see patients for sick visits, but many do, so it is worth a call to see if yours does. If you don't know where your local health department is, you can check in your phone book. The CDC also has a tool to help you locate your state's health department, and many state websites also have listings of all of their local health departments.  3) Find a [facility] If your illness is not likely to be very severe or complicated, you may want to try a walk in clinic. These are popping up all over the country in pharmacies, drugstores, and shopping centers. They're usually staffed by nurse practitioners or physician assistants that have been trained to treat common illnesses and complaints. They're usually fairly quick and inexpensive. However, if you have serious medical issues or chronic medical problems, these are probably not your best option.  No Primary Care Doctor: - Call Health Connect at  [phone] - they can help you locate a primary care  doctor that  accepts your insurance, provides certain services, etc. - Physician Referral Service- [phone]  Chronic Pain Problems: Organization         Address  Phone   Notes  [facility]  [phone] Patients need to be referred by their primary care doctor.   Medication Assistance: Organization         Address  Phone   Notes  [facility] Medication [facility] [address] [place], [place] [address] [phone] --Must be a resident of [facility] -- Must have NO insurance coverage whatsoever (no Medicaid/ Medicare, etc.) -- The pt. MUST have a primary care doctor that directs their care regularly and follows them in the community   MedAssist  [phone]   [employer]  [phone]    Agencies that provide inexpensive medical care: Organization         Address  Phone   Notes  [place]  [phone]   [facility]    [phone]   [facility] [place],  [address] [phone]   [place] [address] [place] [phone]   Planned Parenthood    [phone]   [facility]    [phone]   [place] and [place] [address], [place] Phone:  [phone], Fax:  [phone] Hours of Operation:  9 am - 6 pm, M-F.  Also accepts Medicaid/Medicare and self-pay.  [facility] [place], [address], [place] Phone: [phone], Fax: [phone]. Hours of Operation:  8:30 am - 5:30 pm, M-F.  Also accepts Medicaid and self-pay.  [facility] [place] [address] [place] Phone: [phone]   [place], [place], [place] [phone], Ext. [phone] Mondays & Thursdays: 7-9 AM.  First 15 patients are seen on a first come, first serve basis.    [place]  Providers:  Organization         Address  Phone   Notes  [facility] [address], [place] [phone] Also accepts self-pay patients.  [facility] [address], [place]  [phone]   [place], [address], [place] [phone]   [facility] [address] [place] [phone]   Lucianne Lei [address], [place]   [phone] Only accepts [place] Access [place] patients after they have their name applied to their card.   Self-Pay (no insurance) in [facility]:  Organization         Address  Phone   Notes  Sickle Cell Patients, [facility] [place] [phone]   [facility] [place] [phone]   [facility] [place]  [place], [place],  [phone]   [facility]/Dr. Osei-Bonsu  [address] [place] or [address], [place] [phone] Phone number for both [place] and [place] locations is the same.  [facility] [address], [place] [phone]   [facility] [address] [place] or [address] [phone]   [facility] [address], [place] [phone], phone; [phone], fax Sees patients 1st and 3rd Saturday of every month.  Must not qualify for public or private insurance (i.e. Medicaid, Medicare, [place] Health Choice, Veterans' Benefits)  Household income should be no more than 200% of the poverty level The clinic cannot treat you if you are pregnant or think you are pregnant  Sexually transmitted diseases are not treated at the clinic.    Dental Care: Organization         Address  Phone  Notes  [facility] Department of [facility] [place] [phone] Accepts children up to age 12 who are enrolled in [place] or [place]; pregnant women with a Medicaid card; and children who have applied for Medicaid or [place] Health Choice, but were declined, whose parents can pay a reduced fee at time of service.  [facility] Department of [facility]  [address], [place] [phone] Accepts children up to age 24 who are enrolled in [place] or [place]; pregnant women with a Medicaid card; and children who have applied for Medicaid or [place] Health Choice, but were declined, whose parents can pay a reduced fee at time of service.  [place] Adult [facility] Access PROGRAM  [place] [phone] Patients are seen by appointment only. Walk-ins are not accepted. [place] will see patients 18 years  of age and older. Monday - Tuesday (8am-5pm) Most Wednesdays (8:30-5pm) $30 per visit, cash only  [facility] Adult Dental Access PROGRAM  [address], [facility] [phone] Patients are seen by appointment only. Walk-ins are not accepted. [place] will see patients 43 years of age and older. One Wednesday Evening (Monthly: Volunteer Based).  $30 per visit, cash only  [place]  [phone] for adults; Children under age 81, call Graduate Pediatric Dentistry at [phone]. Children aged 70-14, please call [phone] to request a pediatric application.  Dental services are provided in all areas of dental care including fillings, crowns and bridges, complete and partial dentures, implants, gum treatment, root canals, and extractions. Preventive care is also provided. Treatment is provided to both adults and children. Patients are selected via a lottery and there is often a waiting list.   [facility] [address], [place]  [phone] [email or web address]   [facility]  [address] [place], [place] [phone], Ext. [phone] Second and Fourth Thursday of each month, opens at 6:30 AM; Clinic ends at 9 AM.  Patients are seen on a first-come first-served basis, and a limited number are seen during each clinic.   [facility]  [address] Hillard Danker [place], [place] [phone]   Eligibility Requirements You must have lived in [place], [place], or [place] counties for at least the last three months.   You cannot be eligible for state or federal sponsored [employer], including [employer], [place], or [employer].   You generally cannot be eligible for healthcare insurance through your employer.    How to apply: Eligibility screenings are held every Tuesday and Wednesday afternoon from 1:00 pm until 4:00 pm. You do not need an appointment for the interview!  [facility] [address], [place], [place]   [place]  [place] Department  [place]  [phone]    Behavioral Health Resources in the Community: Intensive Outpatient Programs Organization         Address  Phone  Notes  [place]. [address] [place], [place] [phone]   [facility]patient [address] [place], [place]   ADS: Alcohol & Drug Svcs [address] [place], [place]   [place] [address],  [place], [place] or [phone]   Substance Abuse Resources Organization         Address  Phone  Notes  Alcohol and Drug Services  [phone]   [place]  [phone]   The [place]   Chinita Pester  [phone]   Residential & Outpatient Substance Abuse Program  [phone]   Psychological Services Organization         Address  Phone  Notes  [facility] [place]  [place]  [phone]   [place] [address] [place] or [phone]    Mobile Crisis Teams Organization         Address  Phone  Notes  [facility], Mobile Crisis Care Unit  [phone]   [facility]  [address] [place], [place]   [facility] [address] [place] [phone]    Self-Help/Support Groups Organization  Address  Phone             Notes  [place]. of [place] - variety of support groups  [place] Call for more information  Narcotics Anonymous (NA), [facility] [address], [employer] [place]  2 meetings at this location   Special educational needs teacher         Address  Phone  Notes  [facility]AP Residential Treatment [place],    [place]  [phone]   [facility]  [address], [place] [phone], [place], [place]   [place], [place] [phone] Admissions: 8am-3pm M-F  Incentives Substance [place] [address],    [place], [place] [phone]   [facility] [address] [place], [place], [place]   [facility] [address],  [place], [place]   [facility] Programs - Intensive Outpatient [address], [place], [place]   [facility] [place].,  [place], [place] [phone] or [phone]   [facility] (RTS) [address] [place], [place] Accepts Medicaid  [facility] [place] [address]  [place] [phone] Substance Abuse/Addiction Treatment   [facility] Organization         Address  Phone  Notes  [facility]  [phone]   Domenic Schwab, PhD [address] Arlis Porta [place], [place]   [phone] or [phone]    [place], [place] [phone]   [facility] [address], [place], [place] [phone] Insurance/Medicaid/sponsorship through [facility] and Families [address], [address]                                    [place], [place] [phone] [place] [address], [place] [phone]    Dr. Adele Schilder  [phone]   [facility] Dept. 1) [address], [place] 2) [place] 3)  [place] [address], [place] [phone]  [phone]   [place] [phone] or [phone] (After Hours)

## 2014-10-01 ENCOUNTER — Encounter (HOSPITAL_BASED_OUTPATIENT_CLINIC_OR_DEPARTMENT_OTHER): Payer: Self-pay

## 2014-10-01 ENCOUNTER — Emergency Department (HOSPITAL_BASED_OUTPATIENT_CLINIC_OR_DEPARTMENT_OTHER)
Admission: EM | Admit: 2014-10-01 | Discharge: 2014-10-01 | Disposition: A | Discharge status: Home / Self Care | Payer: PRIVATE HEALTH INSURANCE | Attending: Emergency Medicine | Admitting: Emergency Medicine

## 2014-10-01 DIAGNOSIS — I10 Essential (primary) hypertension: Secondary | ICD-10-CM | POA: Insufficient documentation

## 2014-10-01 DIAGNOSIS — M545 Low back pain, unspecified: Secondary | ICD-10-CM

## 2014-10-01 DIAGNOSIS — Z331 Pregnant state, incidental: Secondary | ICD-10-CM | POA: Insufficient documentation

## 2014-10-01 HISTORY — DX: Unspecified ectopic pregnancy without intrauterine pregnancy: O00.90

## 2014-10-01 LAB — URINE MICROSCOPIC-ADD ON

## 2014-10-01 LAB — URINALYSIS, ROUTINE W REFLEX MICROSCOPIC
Bilirubin Urine: NEGATIVE
Glucose, UA: NEGATIVE mg/dL
HGB URINE DIPSTICK: NEGATIVE
Ketones, ur: NEGATIVE mg/dL
Nitrite: NEGATIVE
Protein, ur: NEGATIVE mg/dL
Specific Gravity, Urine: 1.02 (ref 1.005–1.030)
UROBILINOGEN UA: 2 mg/dL — AB (ref 0.0–1.0)
pH: 7 (ref 5.0–8.0)

## 2014-10-01 LAB — PREGNANCY, URINE: Preg Test, Ur: POSITIVE — AB

## 2014-10-01 NOTE — ED Notes (Signed)
MD at bedside. 

## 2014-10-01 NOTE — Discharge Instructions (Signed)

## 2014-10-01 NOTE — ED Notes (Signed)
Lower back pain associated with am stiffness.  Also has urinary frequency without dysuria x 2 weeks.  Denies injury.

## 2014-10-01 NOTE — ED Provider Notes (Signed)
CSN: [id number]     Arrival date & time 10/01/14  1056 History   First MD Initiated Contact with Patient 10/01/14 1103     Chief Complaint  Patient presents with  . Back Pain     (Consider location/radiation/quality/duration/timing/severity/associated sxs/prior Treatment) Patient is a 34 y.o. female presenting with back pain.  Back Pain Location:  Lumbar spine Quality:  Stiffness Stiffness is present:  In the morning and at night Radiates to:  Does not radiate Pain severity:  Moderate Pain is:  Same all the time Onset quality:  Gradual Timing:  Constant Progression:  Waxing and waning Chronicity:  New Context: not recent illness   Relieved by:  Nothing Worsened by:  Nothing tried Ineffective treatments:  None tried Associated symptoms: no abdominal pain and no fever     Past Medical History  Diagnosis Date  . Hypertension   . Ectopic pregnancy    Past Surgical History  Procedure Laterality Date  . Laparoscopy     No family history on file. History  Substance Use Topics  . Smoking status: Never Smoker   . Smokeless tobacco: Not on file  . Alcohol Use: No   OB History    No data available     Review of Systems  Constitutional: Negative for fever.  Gastrointestinal: Negative for abdominal pain.  Musculoskeletal: Positive for back pain.  All other systems reviewed and are negative.     Allergies  Review of patient's allergies indicates no known allergies.  Home Medications   Prior to Admission medications   Not on File   BP 179/72 mmHg  Pulse 68  Temp(Src) 98.4 F (36.9 C) (Oral)  Resp 16  Ht 5\' 2"  (1.575 m)  Wt 236 lb (107.049 kg)  BMI 43.15 kg/m2  SpO2 100%  LMP 08/20/2014 Physical Exam  Constitutional: She is oriented to person, place, and time. She appears well-developed and well-nourished.  HENT:  Head: Normocephalic and atraumatic.  Right Ear: External ear normal.  Left Ear: External ear normal.  Eyes: Conjunctivae and EOM are normal.  Pupils are equal, round, and reactive to light.  Neck: Normal range of motion. Neck supple.  Cardiovascular: Normal rate, regular rhythm, normal heart sounds and intact distal pulses.   Pulmonary/Chest: Effort normal and breath sounds normal.  Abdominal: Soft. Bowel sounds are normal. There is no tenderness.  Musculoskeletal: Normal range of motion.       Cervical back: Normal.       Thoracic back: Normal.       Lumbar back: Normal.  Neurological: She is alert and oriented to person, place, and time.  Skin: Skin is warm and dry.  Vitals reviewed.   ED Course  Procedures (including critical care time) Labs Review Labs Reviewed  PREGNANCY, URINE - Abnormal; Notable for the following:    Preg Test, Ur POSITIVE (*)    All other components within normal limits  URINALYSIS, ROUTINE W REFLEX MICROSCOPIC (NOT AT [facility]) - Abnormal; Notable for the following:    APPearance CLOUDY (*)    Urobilinogen, UA 2.0 (*)    Leukocytes, UA SMALL (*)    All other components within normal limits  URINE MICROSCOPIC-ADD ON - Abnormal; Notable for the following:    Squamous Epithelial / LPF FEW (*)    All other components within normal limits    Imaging Review No results found.   EKG Interpretation None      MDM   Final diagnoses:  Bilateral low back pain without sciatica  Pregnancy as incidental finding    34 y.o. female with pertinent PMH of HTN, prior ectopic pregnancy presents with numerous complaints, primarily with back pain, intermittent, present at night and on awakening.  No fevers, dysuria.  Pt does have increased urination.  No nausea, vomiting, or abd pain.  LMP over 1 month ago.   Elba Barman as above with +pregnancy test.  Pt states this does not feel like prior ectopic, and as she has no abd pain and very mild back pain which is only present at times, do not feel wu is necessary at this time, however stressed importance of close fu with womens for confirmatory [place].  Pt is agreeable with  plan.  DC home in stable condition.    I have reviewed all laboratory and imaging studies if ordered as above  1. Bilateral low back pain without sciatica   2. Pregnancy as incidental finding         Debby Freiberg, MD 10/01/14 1152

## 2014-10-05 ENCOUNTER — Encounter (HOSPITAL_BASED_OUTPATIENT_CLINIC_OR_DEPARTMENT_OTHER): Payer: Self-pay | Admitting: Emergency Medicine

## 2014-10-05 DIAGNOSIS — Z3A01 Less than 8 weeks gestation of pregnancy: Secondary | ICD-10-CM | POA: Insufficient documentation

## 2014-10-05 DIAGNOSIS — A5901 Trichomonal vulvovaginitis: Secondary | ICD-10-CM | POA: Insufficient documentation

## 2014-10-05 DIAGNOSIS — O200 Threatened abortion: Secondary | ICD-10-CM | POA: Insufficient documentation

## 2014-10-05 DIAGNOSIS — Z79899 Other long term (current) drug therapy: Secondary | ICD-10-CM | POA: Insufficient documentation

## 2014-10-05 DIAGNOSIS — E079 Disorder of thyroid, unspecified: Secondary | ICD-10-CM | POA: Insufficient documentation

## 2014-10-05 DIAGNOSIS — I10 Essential (primary) hypertension: Secondary | ICD-10-CM | POA: Insufficient documentation

## 2014-10-05 NOTE — ED Notes (Signed)
Patient reports that she is having vaginal bleeding and a possible miscarriage.

## 2014-10-06 ENCOUNTER — Encounter (HOSPITAL_BASED_OUTPATIENT_CLINIC_OR_DEPARTMENT_OTHER): Payer: Self-pay | Admitting: Emergency Medicine

## 2014-10-06 ENCOUNTER — Inpatient Hospital Stay (HOSPITAL_BASED_OUTPATIENT_CLINIC_OR_DEPARTMENT_OTHER)
Admission: AD | Admit: 2014-10-06 | Discharge: 2014-10-06 | Disposition: A | Discharge status: Other Acute Inpatient Hospital | Payer: Self-pay | Source: Ambulatory Visit | Attending: Obstetrics & Gynecology | Admitting: Obstetrics & Gynecology

## 2014-10-06 ENCOUNTER — Inpatient Hospital Stay (HOSPITAL_COMMUNITY): Payer: PRIVATE HEALTH INSURANCE

## 2014-10-06 DIAGNOSIS — A599 Trichomoniasis, unspecified: Secondary | ICD-10-CM

## 2014-10-06 DIAGNOSIS — O26891 Other specified pregnancy related conditions, first trimester: Secondary | ICD-10-CM

## 2014-10-06 DIAGNOSIS — M549 Dorsalgia, unspecified: Secondary | ICD-10-CM

## 2014-10-06 DIAGNOSIS — O209 Hemorrhage in early pregnancy, unspecified: Secondary | ICD-10-CM

## 2014-10-06 DIAGNOSIS — O200 Threatened abortion: Secondary | ICD-10-CM

## 2014-10-06 HISTORY — DX: Disorder of thyroid, unspecified: E07.9

## 2014-10-06 LAB — URINALYSIS, ROUTINE W REFLEX MICROSCOPIC
BILIRUBIN URINE: NEGATIVE
Glucose, UA: NEGATIVE mg/dL
KETONES UR: NEGATIVE mg/dL
Nitrite: NEGATIVE
PH: 6.5 (ref 5.0–8.0)
Protein, ur: NEGATIVE mg/dL
Specific Gravity, Urine: 1.023 (ref 1.005–1.030)
UROBILINOGEN UA: 4 mg/dL — AB (ref 0.0–1.0)

## 2014-10-06 LAB — WET PREP, GENITAL: YEAST WET PREP: NONE SEEN

## 2014-10-06 LAB — BASIC METABOLIC PANEL
Anion gap: 8 (ref 5–15)
BUN: 12 mg/dL (ref 6–20)
CHLORIDE: 104 mmol/L (ref 101–111)
CO2: 26 mmol/L (ref 22–32)
Calcium: 9 mg/dL (ref 8.9–10.3)
Creatinine, Ser: 0.67 mg/dL (ref 0.44–1.00)
GFR calc non Af Amer: >60 mL/min (ref >60)
Glucose, Bld: 100 mg/dL — ABNORMAL HIGH (ref 65–99)
Potassium: 3.3 mmol/L — ABNORMAL LOW (ref 3.5–5.1)
SODIUM: 138 mmol/L (ref 135–145)

## 2014-10-06 LAB — URINE MICROSCOPIC-ADD ON

## 2014-10-06 LAB — CBC WITH DIFFERENTIAL/PLATELET
BASOS ABS: 0 10*3/uL (ref 0.0–0.1)
Basophils Relative: 0 % (ref 0–1)
Eosinophils Absolute: 0.1 10*3/uL (ref 0.0–0.7)
Eosinophils Relative: 1 % (ref 0–5)
HCT: 29.7 % — ABNORMAL LOW (ref 36.0–46.0)
HEMOGLOBIN: 9.5 g/dL — AB (ref 12.0–15.0)
Lymphocytes Relative: 16 % (ref 12–46)
Lymphs Abs: 1.6 10*3/uL (ref 0.7–4.0)
MCH: 29.2 pg (ref 26.0–34.0)
MCHC: 32 g/dL (ref 30.0–36.0)
MCV: 91.4 fL (ref 78.0–100.0)
Monocytes Absolute: 0.4 10*3/uL (ref 0.1–1.0)
Monocytes Relative: 4 % (ref 3–12)
NEUTROS ABS: 8.2 10*3/uL — AB (ref 1.7–7.7)
Neutrophils Relative %: 79 % — ABNORMAL HIGH (ref 43–77)
Platelets: 345 10*3/uL (ref 150–400)
RBC: 3.25 MIL/uL — ABNORMAL LOW (ref 3.87–5.11)
RDW: 13.3 % (ref 11.5–15.5)
WBC: 10.3 10*3/uL (ref 4.0–10.5)

## 2014-10-06 LAB — HCG, QUANTITATIVE, PREGNANCY: hCG, Beta Chain, Quant, S: 2670 m[IU]/mL — ABNORMAL HIGH (ref <5)

## 2014-10-06 LAB — PREGNANCY, URINE: PREG TEST UR: POSITIVE — AB

## 2014-10-06 MED ORDER — CEFTRIAXONE SODIUM 250 MG IJ SOLR
250.0000 mg | Freq: Once | INTRAMUSCULAR | Status: AC
  Administered 2014-10-06: 250 mg via INTRAMUSCULAR
  Filled 2014-10-06: qty 250

## 2014-10-06 MED ORDER — LABETALOL HCL 100 MG PO TABS: 100.0000 mg | ORAL_TABLET | Freq: Two times a day (BID) | ORAL | Status: DC

## 2014-10-06 MED ORDER — METRONIDAZOLE 500 MG PO TABS
2000.0000 mg | ORAL_TABLET | Freq: Once | ORAL | Status: AC
  Administered 2014-10-06: 2000 mg via ORAL
  Filled 2014-10-06: qty 4

## 2014-10-06 MED ORDER — NITROFURANTOIN MONOHYD MACRO 100 MG PO CAPS
100.0000 mg | ORAL_CAPSULE | Freq: Once | ORAL | Status: AC
  Administered 2014-10-06: 100 mg via ORAL
  Filled 2014-10-06: qty 1

## 2014-10-06 MED ORDER — AZITHROMYCIN 1 G PO PACK
1.0000 g | PACK | Freq: Once | ORAL | Status: AC
  Administered 2014-10-06: 1 g via ORAL
  Filled 2014-10-06: qty 1

## 2014-10-06 NOTE — ED Provider Notes (Signed)
CSN: [id number]     Arrival date & time 10/05/14  2300 History   First MD Initiated Contact with Patient 10/06/14 0043     Chief Complaint  Patient presents with  . Vaginal Bleeding     (Consider location/radiation/quality/duration/timing/severity/associated sxs/prior Treatment) Patient is a 34 y.o. female presenting with vaginal bleeding. The history is provided by the patient. No language interpreter was used.  Vaginal Bleeding Quality:  Dark red Severity:  Moderate Onset quality:  Gradual Timing:  Constant Progression:  Unchanged Chronicity:  New Context: not after intercourse   Relieved by:  Nothing Worsened by:  Nothing tried Ineffective treatments:  None tried Associated symptoms: no vaginal discharge   Risk factors: no bleeding disorder     Past Medical History  Diagnosis Date  . Hypertension   . Ectopic pregnancy    Past Surgical History  Procedure Laterality Date  . Laparoscopy     History reviewed. No pertinent family history. Social History  Substance Use Topics  . Smoking status: Never Smoker   . Smokeless tobacco: None  . Alcohol Use: No   OB History    No data available     Review of Systems  Genitourinary: Positive for vaginal bleeding and pelvic pain. Negative for vaginal discharge.  All other systems reviewed and are negative.     Allergies  Review of patient's allergies indicates no known allergies.  Home Medications   Prior to Admission medications   Medication Sig Start Date End Date Taking? Authorizing Provider  levothyroxine (SYNTHROID, LEVOTHROID) 150 MCG tablet Take 150 mcg by mouth daily before breakfast.   Yes Historical Provider, MD  lisinopril (PRINIVIL,ZESTRIL) 10 MG tablet Take 10 mg by mouth daily.   Yes Historical Provider, MD   BP 154/88 mmHg  Pulse 78  Temp(Src) 98.5 F (36.9 C) (Oral)  Resp 18  Ht 5\' 2"  (1.575 m)  Wt 236 lb (107.049 kg)  BMI 43.15 kg/m2  SpO2 100%  LMP 08/20/2014 Physical Exam   Constitutional: She is oriented to person, place, and time. She appears well-developed and well-nourished. No distress.  HENT:  Head: Normocephalic and atraumatic.  Mouth/Throat: Oropharynx is clear and moist.  Eyes: Conjunctivae are normal. Pupils are equal, round, and reactive to light.  Neck: Normal range of motion. Neck supple.  Cardiovascular: Normal rate, regular rhythm and intact distal pulses.   Pulmonary/Chest: Effort normal and breath sounds normal. No respiratory distress. She has no wheezes. She has no rales.  Abdominal: Soft. Bowel sounds are normal. There is no tenderness. There is no rebound and no guarding.  Genitourinary: No vaginal discharge found.  Scant bleeding, no adnexal tenderness chaperone present  Musculoskeletal: Normal range of motion.  Neurological: She is alert and oriented to person, place, and time.  Skin: Skin is warm and dry.  Psychiatric: She has a normal mood and affect.    ED Course  Procedures (including critical care time) Labs Review Labs Reviewed  WET PREP, GENITAL - Abnormal; Notable for the following:    Trich, Wet Prep FEW (*)    Clue Cells Wet Prep HPF POC FEW (*)    WBC, Wet Prep HPF POC FEW (*)    All other components within normal limits  PREGNANCY, URINE - Abnormal; Notable for the following:    Preg Test, Ur POSITIVE (*)    All other components within normal limits  URINALYSIS, ROUTINE W REFLEX MICROSCOPIC (NOT AT [facility]) - Abnormal; Notable for the following:    APPearance CLOUDY (*)  Hgb urine dipstick LARGE (*)    Urobilinogen, UA 4.0 (*)    Leukocytes, UA MODERATE (*)    All other components within normal limits  CBC WITH DIFFERENTIAL/PLATELET - Abnormal; Notable for the following:    RBC 3.25 (*)    Hemoglobin 9.5 (*)    HCT 29.7 (*)    Neutrophils Relative % 79 (*)    Neutro Abs 8.2 (*)    All other components within normal limits  BASIC METABOLIC PANEL - Abnormal; Notable for the following:    Potassium 3.3 (*)     Glucose, Bld 100 (*)    All other components within normal limits  URINE MICROSCOPIC-ADD ON - Abnormal; Notable for the following:    Bacteria, UA FEW (*)    All other components within normal limits  HCG, QUANTITATIVE, PREGNANCY  GC/CHLAMYDIA PROBE AMP ([place]) NOT AT [facility]    Imaging Review No results found. I, Bharat Antillon-RASCH,Aubrea Meixner K, personally reviewed and evaluated these images and lab results as part of my medical decision-making.   EKG Interpretation None      MDM   Final diagnoses:  None    Asked patient if it was ok to speak with partner in the room, patient has trichomonas.  Will treat for trichomonas GC and chlamydia.  Will need [place] due to previous ectopic and STI which will put her at risk for further ectopic.    Accepted by Dr. Elonda Husky to the MAU.      Veatrice Kells, MD 10/06/14 [phone]

## 2014-10-06 NOTE — MAU Provider Note (Signed)
History     CSN: [id number]  Arrival date and time: 10/05/14 2300   None     Chief Complaint  Patient presents with  . Vaginal Bleeding   HPI  Debra Carr 34 y.o. [id number] @ [redacted]w[redacted]d presents from [facility] who was seen by Dr April Palumbo; she Will treat for trichomonas GC and chlamydia. Will need [place] due to previous ectopic and STI which will put her at risk for further ectopic.   Accepted by Dr. Elonda Husky to the MAU.    Past Medical History  Diagnosis Date  . Hypertension   . Ectopic pregnancy   . Thyroid disorder     Past Surgical History  Procedure Laterality Date  . Laparoscopy      Family History  Problem Relation Age of Onset  . Hypertension Mother     Social History  Substance Use Topics  . Smoking status: Never Smoker   . Smokeless tobacco: None  . Alcohol Use: No    Allergies: No Known Allergies  Prescriptions prior to admission  Medication Sig Dispense Refill Last Dose  . levothyroxine (SYNTHROID, LEVOTHROID) 150 MCG tablet Take 150 mcg by mouth daily before breakfast.   Past Week at Unknown time  . lisinopril (PRINIVIL,ZESTRIL) 10 MG tablet Take 10 mg by mouth daily.   Past Week at Unknown time    Review of Systems  Constitutional: Negative for fever.  All other systems reviewed and are negative.  Physical Exam   Blood pressure 172/103, pulse 75, temperature 98.5 F (36.9 C), temperature source Oral, resp. rate 16, height 5\' 2"  (1.575 m), weight 107.049 kg (236 lb), last menstrual period 08/20/2014, SpO2 100 %.  Physical Exam  Nursing note and vitals reviewed. Constitutional: She is oriented to person, place, and time. She appears well-developed. No distress.  Neck: Normal range of motion.  Cardiovascular: Normal rate.   Respiratory: Effort normal. No respiratory distress.  GI: Soft. There is no tenderness.  Neurological: She is alert and oriented to person, place, and time.  Skin: Skin is warm and dry.  Psychiatric: She has  a normal mood and affect. Her behavior is normal. Judgment and thought content normal.   US Ob Comp Less 14 Wks  10/06/2014   CLINICAL DATA:  Acute onset of lower back pain and vaginal bleeding. Initial encounter.  EXAM: OBSTETRIC <14 WK [place] AND TRANSVAGINAL OB US  TECHNIQUE: Both transabdominal and transvaginal ultrasound examinations were performed for complete evaluation of the gestation as well as the maternal uterus, adnexal regions, and pelvic cul-de-sac. Transvaginal technique was performed to assess early pregnancy.  COMPARISON:  Pelvic ultrasound performed 03/08/2009  FINDINGS: Intrauterine gestational sac: Visualized / slightly irregular in shape.  Yolk sac:  Yes  Embryo:  Yes  Cardiac Activity: No  Heart Rate: N/A  MSD: 1.07 cm   5 w   5  d  CRL:  5.5  mm   6 w   3 d                  [place] EDC: 05/31/2015  Maternal uterus/adnexae: No subchorionic hemorrhage is noted. A large left adnexal pedunculated fibroid is seen, measuring 7.1 x 7.1 x 6.6 cm.  The ovaries are unremarkable in appearance. The right ovary measures 3.2 x 6.1 x 2.4 cm, while the left ovary measures 3.7 x 2.4 x 2.0 cm. No suspicious adnexal masses are seen; there is no evidence for ovarian torsion.  No free fluid is seen within the pelvic  cul-de-sac.  IMPRESSION: 1. Slightly irregular intrauterine gestational sac noted, with a mean sac diameter of 1.1 cm. The apparent embryo is larger than expected for the size of the sac; no heart rate is yet seen. Though this is somewhat concerning, follow-up pelvic ultrasound is suggested in 2 weeks, if the patient's quantitative beta HCG level continues to rise. 2. Large pedunculated fibroid at the left adnexa, measuring 7.1 cm.   Electronically Signed   By: Garald Balding M.D.   On: 10/06/2014 04:12   US Ob Transvaginal  10/06/2014   CLINICAL DATA:  Acute onset of lower back pain and vaginal bleeding. Initial encounter.  EXAM: OBSTETRIC <14 WK [place] AND TRANSVAGINAL OB US  TECHNIQUE: Both transabdominal  and transvaginal ultrasound examinations were performed for complete evaluation of the gestation as well as the maternal uterus, adnexal regions, and pelvic cul-de-sac. Transvaginal technique was performed to assess early pregnancy.  COMPARISON:  Pelvic ultrasound performed 03/08/2009  FINDINGS: Intrauterine gestational sac: Visualized / slightly irregular in shape.  Yolk sac:  Yes  Embryo:  Yes  Cardiac Activity: No  Heart Rate: N/A  MSD: 1.07 cm   5 w   5  d  CRL:  5.5  mm   6 w   3 d                  [place] EDC: 05/31/2015  Maternal uterus/adnexae: No subchorionic hemorrhage is noted. A large left adnexal pedunculated fibroid is seen, measuring 7.1 x 7.1 x 6.6 cm.  The ovaries are unremarkable in appearance. The right ovary measures 3.2 x 6.1 x 2.4 cm, while the left ovary measures 3.7 x 2.4 x 2.0 cm. No suspicious adnexal masses are seen; there is no evidence for ovarian torsion.  No free fluid is seen within the pelvic cul-de-sac.  IMPRESSION: 1. Slightly irregular intrauterine gestational sac noted, with a mean sac diameter of 1.1 cm. The apparent embryo is larger than expected for the size of the sac; no heart rate is yet seen. Though this is somewhat concerning, follow-up pelvic ultrasound is suggested in 2 weeks, if the patient's quantitative beta HCG level continues to rise. 2. Large pedunculated fibroid at the left adnexa, measuring 7.1 cm.   Electronically Signed   By: Garald Balding M.D.   On: 10/06/2014 04:12    MAU Course  Procedures  MDM  Pelvic Ultrasound done along with Beta HCG results evaluated. Reccommended to repeat Beta HCG labs in 48 hours to evaluate appropriate rise. Start patient on labetolol 100mg  BID for Blood pressure control. Advised pt to continue to take her thyroid medication. Assessment and Plan  IUP @ 5+6  Return to MAU for repeat Beta HCG Quant in 48 hours Discharge to home   [facility] 10/06/2014, 4:29 AM

## 2014-10-06 NOTE — Discharge Instructions (Signed)
Threatened Miscarriage A threatened miscarriage is when you have vaginal bleeding during your first 20 weeks of pregnancy but the pregnancy has not ended. Your doctor will do tests to make sure you are still pregnant. The cause of the bleeding may not be known. This condition does not mean your pregnancy will end. It does increase the risk of it ending (complete miscarriage). HOME CARE   Make sure you keep all your doctor visits for prenatal care.  Get plenty of rest.  Do not have sex or use tampons if you have vaginal bleeding.  Do not douche.  Do not smoke or use drugs.  Do not drink alcohol.  Avoid caffeine. GET HELP IF:  You have light bleeding from your vagina.  You have belly pain or cramping.  You have a fever. GET HELP RIGHT AWAY IF:   You have heavy bleeding from your vagina.  You have clots of blood coming from your vagina.  You have bad pain or cramps in your low back or belly.  You have fever, chills, and bad belly pain. MAKE SURE YOU:   Understand these instructions.  Will watch your condition.  Will get help right away if you are not doing well or get worse. Document Released: 01/23/2008 Document Revised: 02/14/2013 Document Reviewed: 12/06/2012 [facility] Patient Information 2015 [place], [place]. This information is not intended to replace advice given to you by your health care provider. Make sure you discuss any questions you have with your health care provider.

## 2014-10-06 NOTE — ED Notes (Signed)
Pt. Reports lower abd pain and low back pain started tonight.  Pt. Reports she started having vaginal bleeding yesterday.  Pt. In no distress and reports she has had bright red to dark red vaginal bleeding all day today.  Pt. Reports she has no other symptoms.

## 2014-10-06 NOTE — MAU Note (Signed)
[facility] car from [facility].  Vag Bleeding pinkish then red now, little more than spotting.  Lower back pain.  Found out was pregnant on 8/8.

## 2014-10-06 NOTE — Progress Notes (Signed)
Lori Clemmons CNM in to discuss test results and d/c plan. Written and verbal d/c instructions given and understanding voiced. 

## 2014-10-08 LAB — GC/CHLAMYDIA PROBE AMP (~~LOC~~) NOT AT ARMC
CHLAMYDIA, DNA PROBE: NEGATIVE
Neisseria Gonorrhea: NEGATIVE

## 2014-12-30 ENCOUNTER — Inpatient Hospital Stay (HOSPITAL_COMMUNITY)
Admission: AD | Admit: 2014-12-30 | Discharge: 2014-12-30 | Disposition: A | Discharge status: Home / Self Care | Payer: Medicaid Other | Source: Ambulatory Visit | Attending: Obstetrics & Gynecology | Admitting: Obstetrics & Gynecology

## 2014-12-30 ENCOUNTER — Inpatient Hospital Stay (HOSPITAL_COMMUNITY): Payer: Medicaid Other

## 2014-12-30 ENCOUNTER — Encounter (HOSPITAL_COMMUNITY): Payer: Self-pay | Admitting: *Deleted

## 2014-12-30 DIAGNOSIS — D259 Leiomyoma of uterus, unspecified: Secondary | ICD-10-CM

## 2014-12-30 DIAGNOSIS — Z3A01 Less than 8 weeks gestation of pregnancy: Secondary | ICD-10-CM | POA: Insufficient documentation

## 2014-12-30 DIAGNOSIS — R1032 Left lower quadrant pain: Secondary | ICD-10-CM

## 2014-12-30 DIAGNOSIS — O10911 Unspecified pre-existing hypertension complicating pregnancy, first trimester: Secondary | ICD-10-CM | POA: Insufficient documentation

## 2014-12-30 DIAGNOSIS — O3411 Maternal care for benign tumor of corpus uteri, first trimester: Secondary | ICD-10-CM

## 2014-12-30 DIAGNOSIS — O161 Unspecified maternal hypertension, first trimester: Secondary | ICD-10-CM

## 2014-12-30 LAB — URINALYSIS, ROUTINE W REFLEX MICROSCOPIC
Bilirubin Urine: NEGATIVE
GLUCOSE, UA: NEGATIVE mg/dL
HGB URINE DIPSTICK: NEGATIVE
Ketones, ur: NEGATIVE mg/dL
Leukocytes, UA: NEGATIVE
Nitrite: NEGATIVE
Protein, ur: NEGATIVE mg/dL
SPECIFIC GRAVITY, URINE: 1.01 (ref 1.005–1.030)
Urobilinogen, UA: 0.2 mg/dL (ref 0.0–1.0)
pH: 6.5 (ref 5.0–8.0)

## 2014-12-30 LAB — CBC
HCT: 32.9 % — ABNORMAL LOW (ref 36.0–46.0)
Hemoglobin: 11 g/dL — ABNORMAL LOW (ref 12.0–15.0)
MCH: 29.6 pg (ref 26.0–34.0)
MCHC: 33.4 g/dL (ref 30.0–36.0)
MCV: 88.7 fL (ref 78.0–100.0)
Platelets: 349 10*3/uL (ref 150–400)
RBC: 3.71 MIL/uL — ABNORMAL LOW (ref 3.87–5.11)
RDW: 13.3 % (ref 11.5–15.5)
WBC: 9.8 10*3/uL (ref 4.0–10.5)

## 2014-12-30 LAB — WET PREP, GENITAL
Trich, Wet Prep: NONE SEEN
Yeast Wet Prep HPF POC: NONE SEEN

## 2014-12-30 LAB — HCG, QUANTITATIVE, PREGNANCY: hCG, Beta Chain, Quant, S: 168808 m[IU]/mL — ABNORMAL HIGH (ref <5)

## 2014-12-30 MED ORDER — LABETALOL HCL 200 MG PO TABS: 200.0000 mg | ORAL_TABLET | Freq: Two times a day (BID) | ORAL | Status: DC

## 2014-12-30 NOTE — MAU Note (Signed)
Patient states that she has a history of hypertension and takes meds but the meds that she is on are not compatible with pregnancy.

## 2014-12-30 NOTE — MAU Note (Signed)
Patient presents at [redacted] weeks gestation with c/o intermittent left flank pain since last week. Denies bleeding or discharge.

## 2014-12-30 NOTE — MAU Provider Note (Signed)
History     CSN: [id number]  Arrival date and time: 12/30/14 1322   First Provider Initiated Contact with Patient 12/30/14 1359      Chief Complaint  Patient presents with  . Flank Pain   HPI  Debra Carr isi a 34 y.o. G3P0020 at [redacted]w[redacted]d. She presents with c/o LLQ pain off/on x 1 wk. She has hx ectopic pregnancy tx with MTX, is worried. She denies any bleeding or spotting. No change in discharge, odor or itching. She has urinary frequency, no urgency or dysuria. She has nausea occ, no vomiting. Pedunculated fibroid left adnexae measuring  7 cm 8/16.  OB History    Gravida Para Term Preterm AB TAB SAB Ectopic Multiple Living   3    2  1 1   0      Past Medical History  Diagnosis Date  . Hypertension   . Ectopic pregnancy   . Thyroid disorder     Past Surgical History  Procedure Laterality Date  . Laparoscopy      Family History  Problem Relation Age of Onset  . Hypertension Mother     Social History  Substance Use Topics  . Smoking status: Never Smoker   . Smokeless tobacco: None  . Alcohol Use: No    Allergies: No Known Allergies  Prescriptions prior to admission  Medication Sig Dispense Refill Last Dose  . Prenatal Vit-Fe Fumarate-FA (PRENATAL MULTIVITAMIN) TABS tablet Take 1 tablet by mouth daily at 12 noon.   12/30/2014 at Unknown time  . labetalol (NORMODYNE) 100 MG tablet Take 1 tablet (100 mg total) by mouth 2 (two) times daily. (Patient not taking: Reported on 12/30/2014) 60 tablet 0     Review of Systems  Constitutional: Positive for malaise/fatigue. Negative for fever and chills.  Gastrointestinal: Positive for nausea and abdominal pain. Negative for vomiting, diarrhea and constipation.  Genitourinary: Positive for frequency. Negative for dysuria and urgency.  Neurological: Negative for dizziness and weakness.   Physical Exam   Blood pressure 173/87, pulse 68, temperature 98.2 F (36.8 C), temperature source Oral, resp. rate 18, height 5' 2.25"  (1.581 m), weight 112.038 kg (247 lb), last menstrual period 11/05/2014.  Physical Exam  Nursing note and vitals reviewed. Constitutional: She is oriented to person, place, and time. She appears well-developed and well-nourished.  Cardiovascular: Normal rate.   Respiratory: Effort normal.  GI: Soft. There is no tenderness.  Genitourinary:  Pelvic exam: Ext gen- nl anatomy, skin intact Vagina- mod amt thin white discharge Cx- closed Uterus-6-8wks size, non tender Adn- left adnexal mass/fullness-? ped fibroid   Musculoskeletal: Normal range of motion.  Neurological: She is alert and oriented to person, place, and time.  Skin: Skin is warm and dry.  Psychiatric: She has a normal mood and affect. Her behavior is normal.    MAU Course  Procedures  MDM Results for orders placed or performed during the hospital encounter of 12/30/14 (from the past 24 hour(s))  Urinalysis, Routine w reflex microscopic (not at [facility])     Status: None   Collection Time: 12/30/14  1:46 PM  Result Value Ref Range   Color, Urine YELLOW YELLOW   APPearance CLEAR CLEAR   Specific Gravity, Urine 1.010 1.005 - 1.030   pH 6.5 5.0 - 8.0   Glucose, UA NEGATIVE NEGATIVE mg/dL   Hgb urine dipstick NEGATIVE NEGATIVE   Bilirubin Urine NEGATIVE NEGATIVE   Ketones, ur NEGATIVE NEGATIVE mg/dL   Protein, ur NEGATIVE NEGATIVE mg/dL   Urobilinogen, UA  0.2 0.0 - 1.0 mg/dL   Nitrite NEGATIVE NEGATIVE   Leukocytes, UA NEGATIVE NEGATIVE  hCG, quantitative, pregnancy     Status: Abnormal   Collection Time: 12/30/14  2:20 PM  Result Value Ref Range   hCG, Beta [place], [place] [address] (H) <5 mIU/mL  CBC     Status: Abnormal   Collection Time: 12/30/14  2:20 PM  Result Value Ref Range   WBC 9.8 4.0 - 10.5 K/uL   RBC 3.71 (L) 3.87 - 5.11 MIL/uL   Hemoglobin 11.0 (L) 12.0 - 15.0 g/dL   HCT 32.9 (L) 36.0 - 46.0 %   MCV 88.7 78.0 - 100.0 fL   MCH 29.6 26.0 - 34.0 pg   MCHC 33.4 30.0 - 36.0 g/dL   RDW 13.3 11.5 - 15.5 %    Platelets 349 150 - 400 K/uL  Wet prep, genital     Status: Abnormal   Collection Time: 12/30/14  2:29 PM  Result Value Ref Range   Yeast Wet Prep HPF POC NONE SEEN NONE SEEN   Trich, Wet Prep NONE SEEN NONE SEEN   Clue Cells Wet Prep HPF POC FEW (A) NONE SEEN   WBC, Wet Prep HPF POC FEW (A) NONE SEEN     U/S- 7 5/7 wks IUP, FHR 157. EDC 08/13/15. Left adnexal fibroid is 9+ cm Consulted with Dr Harolyn Rutherford- start Labetalol 200 mg bid Pt wants to come to [facility]   Assessment and Plan  7 5/7 wks viable IUP 9+cm left adnexal fibroid Hypertension, off meds currrently, start Labetalol Will refer to [facility] to start [place], [place]. 12/30/2014, 2:06 PM

## 2014-12-31 LAB — GC/CHLAMYDIA PROBE AMP (~~LOC~~) NOT AT ARMC
Chlamydia: NEGATIVE
Neisseria Gonorrhea: NEGATIVE

## 2014-12-31 LAB — HIV ANTIBODY (ROUTINE TESTING W REFLEX): HIV Screen 4th Generation wRfx: NONREACTIVE

## 2015-01-14 ENCOUNTER — Encounter: Payer: Self-pay | Admitting: Family Medicine

## 2015-03-07 ENCOUNTER — Ambulatory Visit (INDEPENDENT_AMBULATORY_CARE_PROVIDER_SITE_OTHER): Payer: Medicaid Other | Admitting: Family Medicine

## 2015-03-07 ENCOUNTER — Encounter: Payer: Self-pay | Admitting: Family Medicine

## 2015-03-07 VITALS — BP 145/97 | HR 72 | Temp 97.5°F | Wt 242.3 lb

## 2015-03-07 DIAGNOSIS — O99212 Obesity complicating pregnancy, second trimester: Secondary | ICD-10-CM

## 2015-03-07 DIAGNOSIS — O0990 Supervision of high risk pregnancy, unspecified, unspecified trimester: Secondary | ICD-10-CM | POA: Insufficient documentation

## 2015-03-07 DIAGNOSIS — O10012 Pre-existing essential hypertension complicating pregnancy, second trimester: Secondary | ICD-10-CM

## 2015-03-07 DIAGNOSIS — O09512 Supervision of elderly primigravida, second trimester: Secondary | ICD-10-CM

## 2015-03-07 DIAGNOSIS — O0992 Supervision of high risk pregnancy, unspecified, second trimester: Secondary | ICD-10-CM

## 2015-03-07 DIAGNOSIS — E669 Obesity, unspecified: Secondary | ICD-10-CM

## 2015-03-07 DIAGNOSIS — O99210 Obesity complicating pregnancy, unspecified trimester: Secondary | ICD-10-CM | POA: Insufficient documentation

## 2015-03-07 DIAGNOSIS — O10019 Pre-existing essential hypertension complicating pregnancy, unspecified trimester: Secondary | ICD-10-CM

## 2015-03-07 DIAGNOSIS — O09519 Supervision of elderly primigravida, unspecified trimester: Secondary | ICD-10-CM | POA: Insufficient documentation

## 2015-03-07 LAB — COMPREHENSIVE METABOLIC PANEL
ALBUMIN: 3.6 g/dL (ref 3.6–5.1)
ALK PHOS: 29 U/L — AB (ref 33–115)
ALT: 38 U/L — AB (ref 6–29)
AST: 40 U/L — AB (ref 10–30)
BILIRUBIN TOTAL: 0.6 mg/dL (ref 0.2–1.2)
BUN: 6 mg/dL — ABNORMAL LOW (ref 7–25)
CALCIUM: 9.1 mg/dL (ref 8.6–10.2)
CO2: 22 mmol/L (ref 20–31)
Chloride: 105 mmol/L (ref 98–110)
Creat: 0.45 mg/dL — ABNORMAL LOW (ref 0.50–1.10)
Glucose, Bld: 78 mg/dL (ref 65–99)
Potassium: 3.6 mmol/L (ref 3.5–5.3)
Sodium: 139 mmol/L (ref 135–146)
TOTAL PROTEIN: 6.5 g/dL (ref 6.1–8.1)

## 2015-03-07 LAB — POCT URINALYSIS DIP (DEVICE)
GLUCOSE, UA: NEGATIVE mg/dL
Hgb urine dipstick: NEGATIVE
LEUKOCYTES UA: NEGATIVE
NITRITE: NEGATIVE
PH: 6 (ref 5.0–8.0)
Protein, ur: NEGATIVE mg/dL
Specific Gravity, Urine: 1.025 (ref 1.005–1.030)
Urobilinogen, UA: 2 mg/dL — ABNORMAL HIGH (ref 0.0–1.0)

## 2015-03-07 LAB — GLUCOSE TOLERANCE, 1 HOUR (50G) W/O FASTING: Glucose, 1 Hour GTT: 81 mg/dL (ref 70–140)

## 2015-03-07 MED ORDER — ASPIRIN EC 81 MG PO TBEC: 81.0000 mg | DELAYED_RELEASE_TABLET | Freq: Every day | ORAL | Status: DC

## 2015-03-07 NOTE — Progress Notes (Signed)
Subjective:  Debra Carr is a 35 y.o. G3P0020 at [redacted]w[redacted]d being seen today for ongoing prenatal care.  She is currently monitored for the following issues for this high-risk pregnancy and has Supervision of high risk pregnancy, antepartum; Chronic benign essential hypertension, antepartum; Advanced maternal age, primigravida, antepartum; and Maternal obesity, antepartum on her problem list.  Patient reports no complaints.  Contractions: Not present. Vag. Bleeding: None.  Movement: Present. Denies leaking of fluid.   The following portions of the patient's history were reviewed and updated as appropriate: allergies, current medications, past family history, past medical history, past social history, past surgical history and problem list. Problem list updated.  Objective:   Filed Vitals:   03/07/15 0818  BP: 145/97  Pulse: 72  Temp: 97.5 F (36.4 C)  Weight: 242 lb 4.8 oz (109.907 kg)    Fetal Status: Fetal Heart Rate (bpm): 135   Movement: Present     General:  Alert, oriented and cooperative. Patient is in no acute distress.  Skin: Skin is warm and dry. No rash noted.   Cardiovascular: Normal heart rate noted  Respiratory: Normal respiratory effort, no problems with respiration noted  Abdomen: Soft, gravid, appropriate for gestational age. Pain/Pressure: Absent     Pelvic: Vag. Bleeding: None     Cervical exam deferred        Extremities: Normal range of motion.  Edema: None  Mental Status: Normal mood and affect. Normal behavior. Normal judgment and thought content.   Urinalysis:      Assessment and Plan:  Pregnancy: G3P0020 at [redacted]w[redacted]d  1. Chronic benign essential hypertension, antepartum - on Labetalol 200 BID, no need to increase today - Recommended starting ASA 81 mg - Comprehensive metabolic panel - Protein / creatinine ratio, urine  2. Supervision of high risk pregnancy, antepartum, second trimester - added box - Hemoglobinopathy evaluation - Prenatal Profile -  Prescript Monitor Profile(19) - Culture, OB Urine - Glucose Tolerance, 1 HR (50g) - [place] MFM OB COMP + 14 WK; Future- scheduled today - Genetic Screening  3. Advanced maternal age, primigravida, antepartum, second trimester -Desires NIPT  4. Maternal obesity, antepartum, second trimester - recommended 11-20 lb weight gain.   Preterm labor symptoms and general obstetric precautions including but not limited to vaginal bleeding, contractions, leaking of fluid and fetal movement were reviewed in detail with the patient. Please refer to After Visit Summary for other counseling recommendations.  Return in about 4 weeks (around 04/04/2015) for Routine prenatal care.   Caren Macadam, MD

## 2015-03-07 NOTE — Patient Instructions (Addendum)
Second Trimester of Pregnancy The second trimester is from week 13 through week 28, months 4 through 6. The second trimester is often a time when you feel your best. Your body has also adjusted to being pregnant, and you begin to feel better physically. Usually, morning sickness has lessened or quit completely, you may have more energy, and you may have an increase in appetite. The second trimester is also a time when the fetus is growing rapidly. At the end of the sixth month, the fetus is about 9 inches long and weighs about 1 pounds. You will likely begin to feel the baby move (quickening) between 18 and 20 weeks of the pregnancy. BODY CHANGES Your body goes through many changes during pregnancy. The changes vary from woman to woman.   Your weight will continue to increase. You will notice your lower abdomen bulging out.  You may begin to get stretch marks on your hips, abdomen, and breasts.  You may develop headaches that can be relieved by medicines approved by your health care provider.  You may urinate more often because the fetus is pressing on your bladder.  You may develop or continue to have heartburn as a result of your pregnancy.  You may develop constipation because certain hormones are causing the muscles that push waste through your intestines to slow down.  You may develop hemorrhoids or swollen, bulging veins (varicose veins).  You may have back pain because of the weight gain and pregnancy hormones relaxing your joints between the bones in your pelvis and as a result of a shift in weight and the muscles that support your balance.  Your breasts will continue to grow and be tender.  Your gums may bleed and may be sensitive to brushing and flossing.  Dark spots or blotches (chloasma, mask of pregnancy) may develop on your face. This will likely fade after the baby is born.  A dark line from your belly button to the pubic area (linea nigra) may appear. This will likely fade  after the baby is born.  You may have changes in your hair. These can include thickening of your hair, rapid growth, and changes in texture. Some women also have hair loss during or after pregnancy, or hair that feels dry or thin. Your hair will most likely return to normal after your baby is born. WHAT TO EXPECT AT YOUR PRENATAL VISITS During a routine prenatal visit:  You will be weighed to make sure you and the fetus are growing normally.  Your blood pressure will be taken.  Your abdomen will be measured to track your baby's growth.  The fetal heartbeat will be listened to.  Any test results from the previous visit will be discussed. Your health care provider may ask you:  How you are feeling.  If you are feeling the baby move.  If you have had any abnormal symptoms, such as leaking fluid, bleeding, severe headaches, or abdominal cramping.  If you are using any tobacco products, including cigarettes, chewing tobacco, and electronic cigarettes.  If you have any questions. Other tests that may be performed during your second trimester include:  Blood tests that check for:  Low iron levels (anemia).  Gestational diabetes (between 24 and 28 weeks).  Rh antibodies.  Urine tests to check for infections, diabetes, or protein in the urine.  An ultrasound to confirm the proper growth and development of the baby.  An amniocentesis to check for possible genetic problems.  Fetal screens for spina bifida   and Down syndrome.  HIV (human immunodeficiency virus) testing. Routine prenatal testing includes screening for HIV, unless you choose not to have this test. HOME CARE INSTRUCTIONS   Avoid all smoking, herbs, alcohol, and unprescribed drugs. These chemicals affect the formation and growth of the baby.  Do not use any tobacco products, including cigarettes, chewing tobacco, and electronic cigarettes. If you need help quitting, ask your health care provider. You may receive  counseling support and other resources to help you quit.  Follow your health care provider's instructions regarding medicine use. There are medicines that are either safe or unsafe to take during pregnancy.  Exercise only as directed by your health care provider. Experiencing uterine cramps is a good sign to stop exercising.  Continue to eat regular, healthy meals.  Wear a good support bra for breast tenderness.  Do not use hot tubs, steam rooms, or saunas.  Wear your seat belt at all times when driving.  Avoid raw meat, uncooked cheese, cat litter boxes, and soil used by cats. These carry germs that can cause birth defects in the baby.  Take your prenatal vitamins.  Take 1500-2000 mg of calcium daily starting at the 20th week of pregnancy until you deliver your baby.  Try taking a stool softener (if your health care provider approves) if you develop constipation. Eat more high-fiber foods, such as fresh vegetables or fruit and whole grains. Drink plenty of fluids to keep your urine clear or pale yellow.  Take warm sitz baths to soothe any pain or discomfort caused by hemorrhoids. Use hemorrhoid cream if your health care provider approves.  If you develop varicose veins, wear support hose. Elevate your feet for 15 minutes, 3-4 times a day. Limit salt in your diet.  Avoid heavy lifting, wear low heel shoes, and practice good posture.  Rest with your legs elevated if you have leg cramps or low back pain.  Visit your dentist if you have not gone yet during your pregnancy. Use a soft toothbrush to brush your teeth and be gentle when you floss.  A sexual relationship may be continued unless your health care provider directs you otherwise.  Continue to go to all your prenatal visits as directed by your health care provider. SEEK MEDICAL CARE IF:   You have dizziness.  You have mild pelvic cramps, pelvic pressure, or nagging pain in the abdominal area.  You have persistent nausea,  vomiting, or diarrhea.  You have a bad smelling vaginal discharge.  You have pain with urination. SEEK IMMEDIATE MEDICAL CARE IF:   You have a fever.  You are leaking fluid from your vagina.  You have spotting or bleeding from your vagina.  You have severe abdominal cramping or pain.  You have rapid weight gain or loss.  You have shortness of breath with chest pain.  You notice sudden or extreme swelling of your face, hands, ankles, feet, or legs.  You have not felt your baby move in over an hour.  You have severe headaches that do not go away with medicine.  You have vision changes.   This information is not intended to replace advice given to you by your health care provider. Make sure you discuss any questions you have with your health care provider.   Document Released: 02/03/2001 Document Revised: 03/02/2014 Document Reviewed: 04/12/2012 Elsevier Interactive Patient Education 2016 [place] Medications in Pregnancy   Acne: Benzoyl Peroxide Salicylic Acid  Backache/Headache: Tylenol: 2 regular strength every 4 hours OR  2 Extra strength every 6 hours  Colds/Coughs/Allergies: Benadryl (alcohol free) 25 mg every 6 hours as needed Breath right strips Claritin Cepacol throat lozenges Chloraseptic throat spray Cold-Eeze- up to three times per day Cough drops, alcohol free Flonase (by prescription only) Guaifenesin Mucinex Robitussin DM (plain only, alcohol free) Saline nasal spray/drops Sudafed (pseudoephedrine) & Actifed ** use only after [redacted] weeks gestation and if you do not have high blood pressure Tylenol Vicks Vaporub Zinc lozenges Zyrtec   Constipation: Colace Ducolax suppositories Fleet enema Glycerin suppositories Metamucil Milk of magnesia Miralax Senokot Smooth move tea  Diarrhea: Kaopectate Imodium A-D  *NO pepto Bismol  Hemorrhoids: Anusol Anusol HC Preparation H Tucks  Indigestion: Tums- start this  first Maalox Mylanta Zantac - if TUMS does not work, take this 1 pill twice per day Pepcid  Insomnia: Benadryl (alcohol free) 25mg  every 6 hours as needed Tylenol PM Unisom, no Gelcaps  Leg Cramps: Tums MagGel  Nausea/Vomiting:  Bonine Dramamine Emetrol Ginger extract Sea bands Meclizine  Nausea medication to take during pregnancy:  Unisom (doxylamine succinate 25 mg tablets) Take one tablet daily at bedtime. If symptoms are not adequately controlled, the dose can be increased to a maximum recommended dose of two tablets daily (1/2 tablet in the morning, 1/2 tablet mid-afternoon and one at bedtime). Vitamin B6 100mg  tablets. Take one tablet twice a day (up to 200 mg per day).  Skin Rashes: Aveeno products Benadryl cream or 25mg  every 6 hours as needed Calamine Lotion 1% cortisone cream  Yeast infection: Gyne-lotrimin 7 Monistat 7   **If taking multiple medications, please check labels to avoid duplicating the same active ingredients **take medication as directed on the label ** Do not exceed 4000 mg of tylenol in 24 hours **Do not take medications that contain aspirin or ibuprofen

## 2015-03-07 NOTE — Progress Notes (Signed)
Pt would like to have her TSH levels drawn do to history of.

## 2015-03-08 ENCOUNTER — Telehealth: Payer: Self-pay | Admitting: *Deleted

## 2015-03-08 ENCOUNTER — Other Ambulatory Visit: Payer: Self-pay

## 2015-03-08 DIAGNOSIS — O0992 Supervision of high risk pregnancy, unspecified, second trimester: Secondary | ICD-10-CM

## 2015-03-08 LAB — PRENATAL PROFILE (SOLSTAS)
ANTIBODY SCREEN: NEGATIVE
BASOS ABS: 0 10*3/uL (ref 0.0–0.1)
BASOS PCT: 0 % (ref 0–1)
EOS ABS: 0.1 10*3/uL (ref 0.0–0.7)
Eosinophils Relative: 1 % (ref 0–5)
HEMATOCRIT: 30.5 % — AB (ref 36.0–46.0)
HIV 1&2 Ab, 4th Generation: NONREACTIVE
Hemoglobin: 9.9 g/dL — ABNORMAL LOW (ref 12.0–15.0)
Hepatitis B Surface Ag: NEGATIVE
LYMPHS ABS: 1.3 10*3/uL (ref 0.7–4.0)
Lymphocytes Relative: 17 % (ref 12–46)
MCH: 30.5 pg (ref 26.0–34.0)
MCHC: 32.5 g/dL (ref 30.0–36.0)
MCV: 93.8 fL (ref 78.0–100.0)
MPV: 8.6 fL (ref 8.6–12.4)
Monocytes Absolute: 0.3 10*3/uL (ref 0.1–1.0)
Monocytes Relative: 4 % (ref 3–12)
NEUTROS PCT: 78 % — AB (ref 43–77)
Neutro Abs: 6.2 10*3/uL (ref 1.7–7.7)
Platelets: 300 10*3/uL (ref 150–400)
RBC: 3.25 MIL/uL — AB (ref 3.87–5.11)
RDW: 14 % (ref 11.5–15.5)
RH TYPE: POSITIVE
Rubella: 1.11 Index — ABNORMAL HIGH (ref <0.90)
WBC: 7.9 10*3/uL (ref 4.0–10.5)

## 2015-03-08 LAB — CULTURE, OB URINE

## 2015-03-08 LAB — PROTEIN / CREATININE RATIO, URINE
CREATININE, URINE: 223 mg/dL (ref 20–320)
Protein Creatinine Ratio: 90 mg/g creat (ref 21–161)
TOTAL PROTEIN, URINE: 20 mg/dL (ref 5–24)

## 2015-03-08 MED ORDER — PRENATAL MULTIVITAMIN CH: 1.0000 | ORAL_TABLET | Freq: Every day | ORAL | Status: DC

## 2015-03-08 MED ORDER — PRENATAL VITAMINS PLUS 27-1 MG PO TABS: 1.0000 | ORAL_TABLET | Freq: Once | ORAL | Status: DC

## 2015-03-08 MED ORDER — PRENATAL MULTIVITAMIN CH: 1.0000 | ORAL_TABLET | Freq: Once | ORAL | Status: DC

## 2015-03-08 NOTE — Telephone Encounter (Signed)
Per Dr Ernestina Patches patient needs to take aspirin 81 mg daily to prevent worsening blood pressure. She sent in prescription. Called patient and spoke with her about need for aspirin ant that is should be waiting at her pharmacy. Understanding voiced. She also requested that her prenatal vitamin be called in as well. I stated I would take care of it and sent in script.

## 2015-03-11 LAB — HEMOGLOBINOPATHY EVALUATION
HEMOGLOBIN OTHER: 0 %
HGB S QUANTITAION: 0 %
Hgb A2 Quant: 2.3 % (ref 2.2–3.2)
Hgb A: 97.3 % (ref 96.8–97.8)
Hgb F Quant: 0.4 % (ref 0.0–2.0)

## 2015-03-12 ENCOUNTER — Encounter: Payer: Self-pay | Admitting: Family Medicine

## 2015-03-12 DIAGNOSIS — O99019 Anemia complicating pregnancy, unspecified trimester: Secondary | ICD-10-CM | POA: Insufficient documentation

## 2015-03-12 LAB — FENTANYL (GC/LC/MS), URINE
Fentanyl, confirm: NEGATIVE ng/mL (ref <0.5)
NORFENTANYL (GC/MS) CONFIRM: NEGATIVE ng/mL (ref <0.5)

## 2015-03-13 LAB — PRESCRIPTION MONITORING PROFILE (19 PANEL)
Amphetamine/Meth: NEGATIVE ng/mL
BUPRENORPHINE, URINE: NEGATIVE ng/mL
Barbiturate Screen, Urine: NEGATIVE ng/mL
Benzodiazepine Screen, Urine: NEGATIVE ng/mL
CARISOPRODOL, URINE: NEGATIVE ng/mL
Cannabinoid Scrn, Ur: NEGATIVE ng/mL
Cocaine Metabolites: NEGATIVE ng/mL
Creatinine, Urine: 229.71 mg/dL (ref >20.0)
ECSTASY: NEGATIVE ng/mL
MEPERIDINE UR: NEGATIVE ng/mL
METHAQUALONE SCREEN (URINE): NEGATIVE ng/mL
Methadone Screen, Urine: NEGATIVE ng/mL
NITRITES URINE, INITIAL: NEGATIVE ug/mL
OXYCODONE SCRN UR: NEGATIVE ng/mL
Opiate Screen, Urine: NEGATIVE ng/mL
PROPOXYPHENE: NEGATIVE ng/mL
Phencyclidine, Ur: NEGATIVE ng/mL
TRAMADOL UR: NEGATIVE ng/mL
Tapentadol, urine: NEGATIVE ng/mL
Zolpidem, Urine: NEGATIVE ng/mL
pH, Initial: 5.7 pH (ref 4.5–8.9)

## 2015-03-15 ENCOUNTER — Ambulatory Visit (HOSPITAL_COMMUNITY)
Admission: RE | Admit: 2015-03-15 | Discharge: 2015-03-15 | Disposition: A | Discharge status: Home / Self Care | Payer: Medicaid Other | Source: Ambulatory Visit | Attending: Family Medicine | Admitting: Family Medicine

## 2015-03-15 ENCOUNTER — Encounter (HOSPITAL_COMMUNITY): Payer: Self-pay

## 2015-03-15 ENCOUNTER — Other Ambulatory Visit: Payer: Self-pay | Admitting: Family Medicine

## 2015-03-15 ENCOUNTER — Other Ambulatory Visit: Payer: Self-pay | Admitting: Obstetrics and Gynecology

## 2015-03-15 DIAGNOSIS — Z3689 Encounter for other specified antenatal screening: Secondary | ICD-10-CM

## 2015-03-15 DIAGNOSIS — O09522 Supervision of elderly multigravida, second trimester: Secondary | ICD-10-CM

## 2015-03-15 DIAGNOSIS — Z3A18 18 weeks gestation of pregnancy: Secondary | ICD-10-CM

## 2015-03-15 DIAGNOSIS — O10012 Pre-existing essential hypertension complicating pregnancy, second trimester: Secondary | ICD-10-CM | POA: Insufficient documentation

## 2015-03-15 DIAGNOSIS — O0992 Supervision of high risk pregnancy, unspecified, second trimester: Secondary | ICD-10-CM

## 2015-03-15 DIAGNOSIS — O283 Abnormal ultrasonic finding on antenatal screening of mother: Secondary | ICD-10-CM

## 2015-03-15 DIAGNOSIS — O10019 Pre-existing essential hypertension complicating pregnancy, unspecified trimester: Secondary | ICD-10-CM

## 2015-03-15 DIAGNOSIS — Z315 Encounter for genetic counseling: Secondary | ICD-10-CM | POA: Insufficient documentation

## 2015-03-15 DIAGNOSIS — O09512 Supervision of elderly primigravida, second trimester: Secondary | ICD-10-CM

## 2015-03-15 MED ORDER — LABETALOL HCL 200 MG PO TABS: 200.0000 mg | ORAL_TABLET | Freq: Two times a day (BID) | ORAL | Status: DC

## 2015-03-15 NOTE — Progress Notes (Signed)
Genetic Counseling  High-Risk Gestation Note  Appointment Date:  03/15/2015 Referred By: Caren Macadam,* Date of Birth:  1980-09-28 Partner:  Leavy Cella   Pregnancy History: [id number] Estimated Date of Delivery: 08/12/15 Estimated Gestational Age: [redacted]w[redacted]d Attending: Jolyn Lent, MD   Ms. Jerusha Lamoree and her partner, Mr. Leavy Cella, were seen for genetic counseling because of a maternal age of 35 y.o..   In Summary:  Approximate 1 in 72 risk for fetal aneuploidy related to maternal age  Detailed ultrasound performed today; Echogenic intracardiac focus visualized  EIF increases risk for Down syndrome from age risk to approximately 1 in 75  Patient elected to pursue NIPS (Panorama) today; declined amniocentesis    Follow-up ultrasound scheduled for 04/12/15 to complete fetal anatomic survey.   They were counseled regarding maternal age and the association with risk for chromosome conditions due to nondisjunction with aging of the ova.   We reviewed chromosomes, nondisjunction, and the associated 1 in 111 risk for fetal aneuploidy related to a maternal age of 35 y.o. at [redacted]w[redacted]d gestation.  They were counseled that the risk for aneuploidy decreases as gestational age increases, accounting for those pregnancies which spontaneously abort.  We specifically discussed Down syndrome (trisomy 31), trisomies 57 and 64, and sex chromosome aneuploidies (47,XXX and 47,XXY) including the common features and prognoses of each.   We discussed the benefits and limitations of detailed ultrasound as a screening tool. Detailed ultrasound was performed today. Echogenic intracardiac focus (EIF) was visualized today. Fetal anatomic survey unable to be completed today. Follow-up ultrasound is scheduled for 04/12/15. An isolated echogenic focus is generally believed to be a normal variation without any concerns for the pregnancy.  Isolated echogenic cardiac foci are not associated with congenital  heart defects in the baby or compromised cardiac function after birth.  However, an echogenic cardiac focus is associated with a slightly increased chance for Down syndrome in the pregnancy. Thus, the presence of an EIF would increase the risk for Down syndrome above the patient's age related risk of 1 in 200 to 1 in 100.   We reviewed available screening options including Quad screen and noninvasive prenatal screening (NIPS)/cell free DNA (cfDNA) testing.  They were counseled that screening tests are used to modify a patient's a priori risk for aneuploidy, typically based on age. This estimate provides a pregnancy specific risk assessment. We reviewed the benefits and limitations of each option. Specifically, we discussed the conditions for which each test screens, the detection rates, and false positive rates of each. They were also counseled regarding diagnostic testing via amniocentesis. We reviewed the approximate 1 in [id number] risk for complications for amniocentesis, including spontaneous pregnancy loss. After consideration of all the options, she elected to proceed with NIPS (Panorama through [facility] laboratory).  Those results will be available in 8-10 days.  She declined amniocentesis. They understand that screening tests cannot rule out all birth defects or genetic syndromes. The patient was advised of this limitation.   Ms. Plumley was provided with written information regarding sickle cell anemia (SCA) including the carrier frequency and incidence in the African-American population, the availability of carrier testing and prenatal diagnosis if indicated.  In addition, we discussed that hemoglobinopathies are routinely screened for as part of the [place] newborn screening panel.  She previously had hemoglobin electrophoresis performed through her OB, which was within normal range.  Both family histories were reviewed and found to be noncontributory for birth defects, intellectual disability, and known  genetic conditions. Without further  information regarding the provided family history, an accurate genetic risk cannot be calculated. Further genetic counseling is warranted if more information is obtained.  Ms. Tamula Chiappone denied exposure to environmental toxins or chemical agents. She denied the use of alcohol, tobacco or street drugs. She denied significant viral illnesses during the course of her pregnancy. Her medical and surgical histories were contributory for hypertension, for which she is currently treated with labetalol.   I counseled this couple regarding the above risks and available options.  The approximate face-to-face time with the genetic counselor was 40 minutes.  Chipper Oman, MS,  Certified Genetic Counselor 03/15/2015

## 2015-03-18 ENCOUNTER — Emergency Department (HOSPITAL_BASED_OUTPATIENT_CLINIC_OR_DEPARTMENT_OTHER)
Admission: EM | Admit: 2015-03-18 | Discharge: 2015-03-18 | Disposition: A | Discharge status: Home / Self Care | Payer: Medicaid Other | Attending: Emergency Medicine | Admitting: Emergency Medicine

## 2015-03-18 ENCOUNTER — Telehealth (HOSPITAL_COMMUNITY): Payer: Self-pay | Admitting: MS"

## 2015-03-18 ENCOUNTER — Encounter (HOSPITAL_BASED_OUTPATIENT_CLINIC_OR_DEPARTMENT_OTHER): Payer: Self-pay | Admitting: Emergency Medicine

## 2015-03-18 DIAGNOSIS — I10 Essential (primary) hypertension: Secondary | ICD-10-CM | POA: Diagnosis not present

## 2015-03-18 DIAGNOSIS — R0981 Nasal congestion: Secondary | ICD-10-CM | POA: Diagnosis present

## 2015-03-18 DIAGNOSIS — Z79899 Other long term (current) drug therapy: Secondary | ICD-10-CM | POA: Insufficient documentation

## 2015-03-18 DIAGNOSIS — Z8639 Personal history of other endocrine, nutritional and metabolic disease: Secondary | ICD-10-CM | POA: Insufficient documentation

## 2015-03-18 MED ORDER — CETIRIZINE HCL 10 MG PO CAPS: 1.0000 | ORAL_CAPSULE | Freq: Every day | ORAL | Status: DC

## 2015-03-18 MED ORDER — TRIAMCINOLONE ACETONIDE 55 MCG/ACT NA AERO: 2.0000 | INHALATION_SPRAY | Freq: Every day | NASAL | Status: DC

## 2015-03-18 NOTE — Telephone Encounter (Signed)
Opened in error

## 2015-03-18 NOTE — Discharge Instructions (Signed)
Return to the ED with any concerns including difficulty breathing, vomiting and not able to keep down liquids, chest pain, abdominal pain, decreased level of alertness/lethargy, or any other alarming symptoms

## 2015-03-18 NOTE — ED Provider Notes (Signed)
CSN: [id number]     Arrival date & time 03/18/15  1711 History  By signing my name below, I, Debra Carr, attest that this documentation has been prepared under the direction and in the presence of Alfonzo Beers, MD. Electronically Signed: Hilda Carr, ED Scribe. 03/18/2015. 5:36 PM.    Chief Complaint  Patient presents with  . Nasal Congestion     The history is provided by the patient. No language interpreter was used.   HPI Comments: Debra Carr is a 35 y.o. female who presents to the Emergency Department complaining of constant, worsening nasal congestion that has been present for 3 days. Pt reports that she has been taking Benadryl, Robitussin, and Mucinex with no relief, and reports it is hard for her to breathe especially at night because her congestion worsens when she lays down. Pt also reports her blood pressure has been high, and states it may be from taking all the OTC medications. Pt also reports she has been using OTC nasal spray with no relief, and states she cannot remember what the medication is called. Pt denies fever, cough, chest pain, leg swelling.   Past Medical History  Diagnosis Date  . Hypertension   . Ectopic pregnancy   . Thyroid disorder    Past Surgical History  Procedure Laterality Date  . Laparoscopy     Family History  Problem Relation Age of Onset  . Hypertension Mother    Social History  Substance Use Topics  . Smoking status: Never Smoker   . Smokeless tobacco: None  . Alcohol Use: No   OB History    Gravida Para Term Preterm AB TAB SAB Ectopic Multiple Living   3    2  1 1   0     Review of Systems  Constitutional: Negative for fever.  HENT: Positive for congestion and sinus pressure.   Respiratory: Negative for cough.   Cardiovascular: Negative for chest pain and leg swelling.  All other systems reviewed and are negative.   A complete 10 system review of systems was obtained and all systems are negative except as noted in  the HPI and PMH.    Allergies  Review of patient's allergies indicates no known allergies.  Home Medications   Prior to Admission medications   Medication Sig Start Date End Date Taking? Authorizing Provider  diphenhydrAMINE (BENADRYL) 25 MG tablet Take 25 mg by mouth every 6 (six) hours as needed.   Yes Historical Provider, MD  labetalol (NORMODYNE) 200 MG tablet Take 1 tablet (200 mg total) by mouth 2 (two) times daily. 03/15/15  Yes Peggy Constant, MD  Prenatal Vit-Fe Fumarate-FA (PRENATAL MULTIVITAMIN) TABS tablet Take 1 tablet by mouth daily at 12 noon. 03/08/15  Yes Caren Macadam, MD  Prenatal Vit-Fe Fumarate-FA (PRENATAL VITAMINS PLUS) 27-1 MG TABS Take 1 tablet by mouth once. 03/08/15  Yes Caren Macadam, MD  Cetirizine HCl (ZYRTEC ALLERGY) 10 MG CAPS Take 1 capsule (10 mg total) by mouth daily. 03/18/15   Alfonzo Beers, MD  triamcinolone (NASACORT ALLERGY 24HR) 55 MCG/ACT AERO nasal inhaler Place 2 sprays into the nose daily. 03/18/15   Alfonzo Beers, MD   BP 170/113 mmHg  Pulse 86  Temp(Src) 98.2 F (36.8 C) (Oral)  Resp 18  SpO2 100%  LMP 11/05/2014 (LMP Unknown)  Vitals reviewed Physical Exam  Physical Examination: General appearance - alert, well appearing, and in no distress Mental status - alert, oriented to person, place, and time Eyes - no conjunctival injection,  no scleral icterus Nose - normal and patent, erythema and swelling of inferior nasal turbinates, discharge or polyps Mouth - mucous membranes moist, pharynx normal without lesions Chest - clear to auscultation, no wheezes, rales or rhonchi, symmetric air entry Heart - normal rate, regular rhythm, normal S1, S2, no murmurs, rubs, clicks or gallops Abdomen - soft, nontender, nondistended, no masses or organomegaly Neurological - alert, oriented, normal speech, normal gait Extremities - peripheral pulses normal, no pedal edema, no clubbing or cyanosis Skin - normal coloration and turgor, no  rashes  ED Course  Procedures (including critical care time)  DIAGNOSTIC STUDIES: Oxygen Saturation is 100% on room air, normal by my interpretation.    COORDINATION OF CARE: 5:31 PM Discussed treatment plan with pt at bedside and pt agreed to plan.   Labs Review Labs Reviewed - No data to display  Imaging Review No results found. I have personally reviewed and evaluated these images and lab results as part of my medical decision-making.   EKG Interpretation None      MDM   Final diagnoses:  Nasal congestion    Pt presenting with c/o nasal congestion.  She has tried multiple OTC meds without relief.  Advised zyrtec and nasocort spray.  Advised to continue labetalol, avoid decongestants and close f/u with OB.  Discharged with strict return precautions.  Pt agreeable with plan.  I personally performed the services described in this documentation, which was scribed in my presence. The recorded information has been reviewed and is accurate.     Alfonzo Beers, MD 03/18/15 [phone]

## 2015-03-18 NOTE — ED Notes (Signed)
Pt with nasal congestion x 3 days, states she thinks it is running up her bp

## 2015-03-26 ENCOUNTER — Telehealth (HOSPITAL_COMMUNITY): Payer: Self-pay | Admitting: MS"

## 2015-03-26 NOTE — Telephone Encounter (Signed)
Called Debra Carr to discuss her prenatal cell free DNA test results.  Ms. Debra Carr had Panorama testing through [facility].  Testing was offered because of maternal age.   The patient was identified by name and DOB.  We reviewed that these are within normal limits, showing a less than 1 in 10,000 risk for trisomies 21, 18 and 13, and monosomy X (Turner syndrome).  In addition, the risk for triploidy/vanishing twin and sex chromosome trisomies (47,XXX and 47,XXY) was also low risk. We reviewed that this testing identifies > 99% of pregnancies with trisomy 82, trisomy 48, sex chromosome trisomies (47,XXX and 47,XXY), and triploidy. The detection rate for trisomy 18 is 96%.  The detection rate for monosomy X is ~92%.  The false positive rate is <0.1% for all conditions. Testing was also consistent with female fetal sex.  The patient did wish to know fetal sex.  She understands that this testing does not identify all genetic conditions.  All questions were answered to her satisfaction, she was encouraged to call with additional questions or concerns.  Chipper Oman, MS Certified Genetic Counselor 03/26/2015 4:26 PM

## 2015-04-04 ENCOUNTER — Ambulatory Visit (INDEPENDENT_AMBULATORY_CARE_PROVIDER_SITE_OTHER): Payer: Medicaid Other | Admitting: Obstetrics & Gynecology

## 2015-04-04 VITALS — BP 151/104 | HR 81 | Temp 98.5°F | Wt 246.0 lb

## 2015-04-04 DIAGNOSIS — O09512 Supervision of elderly primigravida, second trimester: Secondary | ICD-10-CM

## 2015-04-04 DIAGNOSIS — O99012 Anemia complicating pregnancy, second trimester: Secondary | ICD-10-CM

## 2015-04-04 DIAGNOSIS — D649 Anemia, unspecified: Secondary | ICD-10-CM | POA: Diagnosis not present

## 2015-04-04 DIAGNOSIS — O99212 Obesity complicating pregnancy, second trimester: Secondary | ICD-10-CM | POA: Diagnosis not present

## 2015-04-04 DIAGNOSIS — O10912 Unspecified pre-existing hypertension complicating pregnancy, second trimester: Secondary | ICD-10-CM | POA: Diagnosis not present

## 2015-04-04 DIAGNOSIS — E669 Obesity, unspecified: Secondary | ICD-10-CM

## 2015-04-04 LAB — POCT URINALYSIS DIP (DEVICE)
Glucose, UA: NEGATIVE mg/dL
Hgb urine dipstick: NEGATIVE
Ketones, ur: NEGATIVE mg/dL
Leukocytes, UA: NEGATIVE
Nitrite: NEGATIVE
PROTEIN: NEGATIVE mg/dL
UROBILINOGEN UA: 4 mg/dL — AB (ref 0.0–1.0)
pH: 6 (ref 5.0–8.0)

## 2015-04-04 MED ORDER — ASPIRIN 81 MG PO TABS: 81.0000 mg | ORAL_TABLET | Freq: Every day | ORAL | Status: DC

## 2015-04-04 MED ORDER — LABETALOL HCL 200 MG PO TABS: 400.0000 mg | ORAL_TABLET | Freq: Two times a day (BID) | ORAL | Status: DC

## 2015-04-04 MED ORDER — FAMOTIDINE 40 MG PO TABS: 40.0000 mg | ORAL_TABLET | Freq: Every day | ORAL | Status: DC

## 2015-04-04 NOTE — Progress Notes (Signed)
Pt reports taking labetalol at 0700  Bottlefeeding

## 2015-04-04 NOTE — Patient Instructions (Signed)
Heartburn During Pregnancy Heartburn is a burning sensation in the chest caused by stomach acid backing up into the esophagus. Heartburn is common in pregnancy because a certain hormone (progesterone) is released when a woman is pregnant. The progesterone hormone may relax the valve that separates the esophagus from the stomach. This allows acid to go up into the esophagus, causing heartburn. Heartburn may also happen in pregnancy because the enlarging uterus pushes up on the stomach, which pushes more acid into the esophagus. This is especially true in the later stages of pregnancy. Heartburn problems usually go away after giving birth. CAUSES  Heartburn is caused by stomach acid backing up into the esophagus. During pregnancy, this may result from various things, including:   The progesterone hormone.  Changing hormone levels.  The growing uterus pushing stomach acid upward.  Large meals.  Certain foods and drinks.  Exercise.  Increased acid production. SIGNS AND SYMPTOMS   Burning pain in the chest or lower throat.  Bitter taste in the mouth.  Coughing. DIAGNOSIS  Your health care provider will typically diagnose heartburn by taking a careful history of your concern. Blood tests may be done to check for a certain type of bacteria that is associated with heartburn. Sometimes, heartburn is diagnosed by prescribing a heartburn medicine to see if the symptoms improve. In some cases, a procedure called an endoscopy may be done. In this procedure, a tube with a light and a camera on the end (endoscope) is used to examine the esophagus and the stomach. TREATMENT  Treatment will vary depending on the severity of your symptoms. Your health care provider may recommend:  Over-the-counter medicines (antacids, acid reducers) for mild heartburn.  Prescription medicines to decrease stomach acid or to protect your stomach lining.  Certain changes in your diet.  Elevating the head of your bed  by putting blocks under the legs. This helps prevent stomach acid from backing up into the esophagus when you are lying down. HOME CARE INSTRUCTIONS   Only take over-the-counter or prescription medicines as directed by your health care provider.  Raise the head of your bed by putting blocks under the legs if instructed to do so by your health care provider. Sleeping with more pillows is not effective because it only changes the position of your head.  Do not exercise right after eating.  Avoid eating 2-3 hours before bed. Do not lie down right after eating.  Eat small meals throughout the day instead of three large meals.  Identify foods and beverages that make your symptoms worse and avoid them. Foods you may want to avoid include:  Peppers.  Chocolate.  High-fat foods, including fried foods.  Spicy foods.  Garlic and onions.  Citrus fruits, including oranges, grapefruit, lemons, and limes.  Food containing tomatoes or tomato products.  Mint.  Carbonated and caffeinated drinks.  Vinegar. SEEK MEDICAL CARE IF:  You have abdominal pain of any kind.  You feel burning in your upper abdomen or chest, especially after eating or lying down.  You have nausea and vomiting.  Your stomach feels upset after you eat. SEEK IMMEDIATE MEDICAL CARE IF:   You have severe chest pain that goes down your arm or into your jaw or neck.  You feel sweaty, dizzy, or light-headed.  You become short of breath.  You vomit blood.  You have difficulty or pain with swallowing.  You have bloody or black, tarry stools.  You have episodes of heartburn more than 3 times a week,   for more than 2 weeks. MAKE SURE YOU:  Understand these instructions.  Will watch your condition.  Will get help right away if you are not doing well or get worse.   This information is not intended to replace advice given to you by your health care provider. Make sure you discuss any questions you have with  your health care provider.   Document Released: 02/07/2000 Document Revised: 03/02/2014 Document Reviewed: 09/28/2012 Elsevier Interactive Patient Education 2016 [employer]  

## 2015-04-04 NOTE — Progress Notes (Signed)
Subjective: heartburn not controlled with Tums  Debra Carr is a 35 y.o. G3P0020 at [redacted]w[redacted]d being seen today for ongoing prenatal care.  She is currently monitored for the following issues for this high-risk pregnancy and has Supervision of high risk pregnancy, antepartum; Chronic benign essential hypertension, antepartum; Advanced maternal age, primigravida, antepartum; Maternal obesity, antepartum; Anemia affecting pregnancy, antepartum; and Echogenic intracardiac focus of fetus on prenatal ultrasound on her problem list.  Patient reports heartburn.  Contractions: Not present. Vag. Bleeding: None.  Movement: Present. Denies leaking of fluid.   The following portions of the patient's history were reviewed and updated as appropriate: allergies, current medications, past family history, past medical history, past social history, past surgical history and problem list. Problem list updated.  Objective:   Filed Vitals:   04/04/15 0823 04/04/15 0827  BP: 154/96 151/104  Pulse: 75 81  Temp: 98.5 F (36.9 C)   Weight: 246 lb (111.585 kg)     Fetal Status: Fetal Heart Rate (bpm): 154   Movement: Present     General:  Alert, oriented and cooperative. Patient is in no acute distress.  Skin: Skin is warm and dry. No rash noted.   Cardiovascular: Normal heart rate noted  Respiratory: Normal respiratory effort, no problems with respiration noted  Abdomen: Soft, gravid, appropriate for gestational age. Pain/Pressure: Absent     Pelvic: Vag. Bleeding: None     Cervical exam deferred        Extremities: Normal range of motion.  Edema: None  Mental Status: Normal mood and affect. Normal behavior. Normal judgment and thought content.   Urinalysis: Urine Protein: Trace Urine Glucose: Negative  Assessment and Plan:  Pregnancy: G3P0020 at [redacted]w[redacted]d  1. Advanced maternal age, primigravida, antepartum, second trimester Needs to increase Labetalol - POCT urinalysis dip (device) - labetalol (NORMODYNE)  200 MG tablet; Take 2 tablets (400 mg total) by mouth 2 (two) times daily.  Dispense: 120 tablet; Refill: 6 - famotidine (PEPCID) 40 MG tablet; Take 1 tablet (40 mg total) by mouth daily.  Dispense: 30 tablet; Refill: 6 - aspirin 81 MG tablet; Take 1 tablet (81 mg total) by mouth daily.  Dispense: 30 tablet; Refill: 6 Pepcid added for reflux symptoms Preterm labor symptoms and general obstetric precautions including but not limited to vaginal bleeding, contractions, leaking of fluid and fetal movement were reviewed in detail with the patient. Please refer to After Visit Summary for other counseling recommendations.  Return in about 2 weeks (around 04/18/2015). F/u US next week in [place], [place]

## 2015-04-09 ENCOUNTER — Other Ambulatory Visit (HOSPITAL_COMMUNITY): Payer: Self-pay

## 2015-04-12 ENCOUNTER — Ambulatory Visit (HOSPITAL_COMMUNITY)
Admission: RE | Admit: 2015-04-12 | Discharge: 2015-04-12 | Disposition: A | Discharge status: Home / Self Care | Payer: Medicaid Other | Source: Ambulatory Visit | Attending: Family Medicine | Admitting: Family Medicine

## 2015-04-12 ENCOUNTER — Encounter (HOSPITAL_COMMUNITY): Payer: Self-pay

## 2015-04-12 ENCOUNTER — Other Ambulatory Visit (HOSPITAL_COMMUNITY): Payer: Self-pay | Admitting: Maternal and Fetal Medicine

## 2015-04-12 VITALS — BP 154/80 | HR 72 | Wt 247.2 lb

## 2015-04-12 DIAGNOSIS — Z36 Encounter for antenatal screening of mother: Secondary | ICD-10-CM | POA: Insufficient documentation

## 2015-04-12 DIAGNOSIS — O10012 Pre-existing essential hypertension complicating pregnancy, second trimester: Secondary | ICD-10-CM | POA: Insufficient documentation

## 2015-04-12 DIAGNOSIS — O4402 Placenta previa specified as without hemorrhage, second trimester: Secondary | ICD-10-CM | POA: Insufficient documentation

## 2015-04-12 DIAGNOSIS — O10019 Pre-existing essential hypertension complicating pregnancy, unspecified trimester: Secondary | ICD-10-CM

## 2015-04-12 DIAGNOSIS — O09522 Supervision of elderly multigravida, second trimester: Secondary | ICD-10-CM

## 2015-04-12 DIAGNOSIS — Z0489 Encounter for examination and observation for other specified reasons: Secondary | ICD-10-CM

## 2015-04-12 DIAGNOSIS — O283 Abnormal ultrasonic finding on antenatal screening of mother: Secondary | ICD-10-CM

## 2015-04-12 DIAGNOSIS — IMO0002 Reserved for concepts with insufficient information to code with codable children: Secondary | ICD-10-CM

## 2015-04-12 DIAGNOSIS — Z3A22 22 weeks gestation of pregnancy: Secondary | ICD-10-CM

## 2015-04-12 DIAGNOSIS — O09512 Supervision of elderly primigravida, second trimester: Secondary | ICD-10-CM | POA: Insufficient documentation

## 2015-04-12 DIAGNOSIS — O99212 Obesity complicating pregnancy, second trimester: Secondary | ICD-10-CM | POA: Insufficient documentation

## 2015-04-18 ENCOUNTER — Telehealth: Payer: Self-pay

## 2015-04-18 ENCOUNTER — Ambulatory Visit (INDEPENDENT_AMBULATORY_CARE_PROVIDER_SITE_OTHER): Payer: Medicaid Other | Admitting: Family

## 2015-04-18 VITALS — BP 145/88 | HR 79 | Temp 98.5°F | Wt 248.9 lb

## 2015-04-18 DIAGNOSIS — O10019 Pre-existing essential hypertension complicating pregnancy, unspecified trimester: Secondary | ICD-10-CM

## 2015-04-18 DIAGNOSIS — D649 Anemia, unspecified: Secondary | ICD-10-CM

## 2015-04-18 DIAGNOSIS — O283 Abnormal ultrasonic finding on antenatal screening of mother: Secondary | ICD-10-CM | POA: Diagnosis not present

## 2015-04-18 DIAGNOSIS — O99019 Anemia complicating pregnancy, unspecified trimester: Secondary | ICD-10-CM

## 2015-04-18 DIAGNOSIS — O09512 Supervision of elderly primigravida, second trimester: Secondary | ICD-10-CM

## 2015-04-18 DIAGNOSIS — O10011 Pre-existing essential hypertension complicating pregnancy, first trimester: Secondary | ICD-10-CM

## 2015-04-18 DIAGNOSIS — O0992 Supervision of high risk pregnancy, unspecified, second trimester: Secondary | ICD-10-CM

## 2015-04-18 LAB — POCT URINALYSIS DIP (DEVICE)
Glucose, UA: NEGATIVE mg/dL
Hgb urine dipstick: NEGATIVE
KETONES UR: NEGATIVE mg/dL
Leukocytes, UA: NEGATIVE
Nitrite: NEGATIVE
PH: 6 (ref 5.0–8.0)
PROTEIN: NEGATIVE mg/dL
Specific Gravity, Urine: >=1.03 (ref 1.005–1.030)
Urobilinogen, UA: 4 mg/dL — ABNORMAL HIGH (ref 0.0–1.0)

## 2015-04-18 MED ORDER — FERROUS SULFATE 325 (65 FE) MG PO TABS: 325.0000 mg | ORAL_TABLET | Freq: Every day | ORAL | Status: DC

## 2015-04-18 MED ORDER — INTEGRA F 125-1 MG PO CAPS: 1.0000 | ORAL_CAPSULE | Freq: Every day | ORAL | Status: DC

## 2015-04-18 NOTE — Progress Notes (Signed)
Pt declined to have flu shot at this time

## 2015-04-18 NOTE — Telephone Encounter (Signed)
Pt called and stated that the iron tablet that was prescribed her insurance does not cover.   Called pt and informed pt that another iron supplement has been sent to her [facility] [address].  Pt stated "thank you".

## 2015-04-18 NOTE — Progress Notes (Signed)
Subjective:  Debra Carr is a 35 y.o. G3P0020 at [redacted]w[redacted]d being seen today for ongoing prenatal care.  She is currently monitored for the following issues for this high-risk pregnancy and has Supervision of high risk pregnancy, antepartum; Chronic benign essential hypertension, antepartum; Advanced maternal age, primigravida, antepartum; Maternal obesity, antepartum; Anemia affecting pregnancy, antepartum; and Echogenic intracardiac focus of fetus on prenatal ultrasound on her problem list.  Patient reports no complaints.  Contractions: Not present. Vag. Bleeding: None.  Movement: Present. Denies leaking of fluid.   The following portions of the patient's history were reviewed and updated as appropriate: allergies, current medications, past family history, past medical history, past social history, past surgical history and problem list. Problem list updated.  Objective:   Filed Vitals:   04/18/15 0813  BP: 145/88  Pulse: 79  Temp: 98.5 F (36.9 C)  Weight: 248 lb 14.4 oz (112.9 kg)    Fetal Status: Fetal Heart Rate (bpm): 151 Fundal Height: 25 cm Movement: Present     General:  Alert, oriented and cooperative. Patient is in no acute distress.  Skin: Skin is warm and dry. No rash noted.   Cardiovascular: Normal heart rate noted  Respiratory: Normal respiratory effort, no problems with respiration noted  Abdomen: Soft, gravid, appropriate for gestational age. Pain/Pressure: Absent     Pelvic: Vag. Bleeding: None     Cervical exam deferred        Extremities: Normal range of motion.  Edema: None  Mental Status: Normal mood and affect. Normal behavior. Normal judgment and thought content.   Urinalysis: Urine Protein: Negative Urine Glucose: Negative  Assessment and Plan:  Pregnancy: G3P0020 at [redacted]w[redacted]d  1. Supervision of high risk pregnancy, antepartum, second trimester - Discussed third trimester labs for next visit  2. Chronic benign essential hypertension, antepartum - Continue  current blood pressure medications  3. Echogenic intracardiac focus of fetus on prenatal ultrasound - Resolved with last ultrasound  4. Anemia affecting pregnancy, antepartum - RX for Integra sent to pharmacy  Preterm labor symptoms and general obstetric precautions including but not limited to vaginal bleeding, contractions, leaking of fluid and fetal movement were reviewed in detail with the patient. Please refer to After Visit Summary for other counseling recommendations.  Return in about 3 weeks (around 05/09/2015).   Venia Carbon Michiel Cowboy, CNM

## 2015-04-22 ENCOUNTER — Telehealth: Payer: Self-pay | Admitting: General Practice

## 2015-04-22 NOTE — Telephone Encounter (Signed)
Called patient and she states that she recently went out of town and left all of her prescriptions there & she needs a refill on all of them. Called patient and told her she has refills on all of her medications. Patient states she didn't know if the pharmacy would fill them since she picked them up not that long ago. Told patient they will but insurance will not pay for it. Patient verbalized understanding & states that's fine. Patient had no other questions

## 2015-04-22 NOTE — Telephone Encounter (Signed)
Patient called and left message stating she is out of her meds. Patient states she has been out of town and has had no meds since Saturday. Patient is calling for a refill.

## 2015-05-09 ENCOUNTER — Encounter: Payer: Medicaid Other | Admitting: Family

## 2015-05-10 ENCOUNTER — Ambulatory Visit (HOSPITAL_COMMUNITY)
Admission: RE | Admit: 2015-05-10 | Discharge: 2015-05-10 | Disposition: A | Discharge status: Home / Self Care | Payer: Medicaid Other | Source: Ambulatory Visit | Attending: Family | Admitting: Family

## 2015-05-10 ENCOUNTER — Encounter (HOSPITAL_COMMUNITY): Payer: Self-pay

## 2015-05-10 VITALS — BP 136/88 | HR 90 | Wt 249.6 lb

## 2015-05-10 DIAGNOSIS — O09519 Supervision of elderly primigravida, unspecified trimester: Secondary | ICD-10-CM

## 2015-05-10 DIAGNOSIS — O99212 Obesity complicating pregnancy, second trimester: Secondary | ICD-10-CM | POA: Insufficient documentation

## 2015-05-10 DIAGNOSIS — O09512 Supervision of elderly primigravida, second trimester: Secondary | ICD-10-CM | POA: Diagnosis not present

## 2015-05-10 DIAGNOSIS — Z3A26 26 weeks gestation of pregnancy: Secondary | ICD-10-CM | POA: Insufficient documentation

## 2015-05-10 DIAGNOSIS — O99019 Anemia complicating pregnancy, unspecified trimester: Secondary | ICD-10-CM

## 2015-05-10 DIAGNOSIS — O10019 Pre-existing essential hypertension complicating pregnancy, unspecified trimester: Secondary | ICD-10-CM

## 2015-05-10 DIAGNOSIS — O10012 Pre-existing essential hypertension complicating pregnancy, second trimester: Secondary | ICD-10-CM | POA: Insufficient documentation

## 2015-05-10 DIAGNOSIS — O283 Abnormal ultrasonic finding on antenatal screening of mother: Secondary | ICD-10-CM

## 2015-05-13 ENCOUNTER — Ambulatory Visit (INDEPENDENT_AMBULATORY_CARE_PROVIDER_SITE_OTHER): Payer: Medicaid Other | Admitting: Family Medicine

## 2015-05-13 ENCOUNTER — Encounter: Payer: Self-pay | Admitting: Family Medicine

## 2015-05-13 VITALS — BP 139/81 | HR 79 | Temp 98.7°F | Wt 248.6 lb

## 2015-05-13 DIAGNOSIS — O10012 Pre-existing essential hypertension complicating pregnancy, second trimester: Secondary | ICD-10-CM

## 2015-05-13 DIAGNOSIS — O0992 Supervision of high risk pregnancy, unspecified, second trimester: Secondary | ICD-10-CM

## 2015-05-13 DIAGNOSIS — Z23 Encounter for immunization: Secondary | ICD-10-CM | POA: Diagnosis not present

## 2015-05-13 DIAGNOSIS — O10019 Pre-existing essential hypertension complicating pregnancy, unspecified trimester: Secondary | ICD-10-CM

## 2015-05-13 LAB — CBC
HEMATOCRIT: 29.9 % — AB (ref 36.0–46.0)
HEMOGLOBIN: 9.5 g/dL — AB (ref 12.0–15.0)
MCH: 30.6 pg (ref 26.0–34.0)
MCHC: 31.8 g/dL (ref 30.0–36.0)
MCV: 96.5 fL (ref 78.0–100.0)
MPV: 8.9 fL (ref 8.6–12.4)
Platelets: 268 10*3/uL (ref 150–400)
RBC: 3.1 MIL/uL — ABNORMAL LOW (ref 3.87–5.11)
RDW: 13.3 % (ref 11.5–15.5)
WBC: 7.2 10*3/uL (ref 4.0–10.5)

## 2015-05-13 LAB — POCT URINALYSIS DIP (DEVICE)
GLUCOSE, UA: NEGATIVE mg/dL
Hgb urine dipstick: NEGATIVE
Leukocytes, UA: NEGATIVE
NITRITE: NEGATIVE
PH: 6 (ref 5.0–8.0)
PROTEIN: 30 mg/dL — AB
Specific Gravity, Urine: >=1.03 (ref 1.005–1.030)
Urobilinogen, UA: 4 mg/dL — ABNORMAL HIGH (ref 0.0–1.0)

## 2015-05-13 NOTE — Patient Instructions (Signed)
Third Trimester of Pregnancy The third trimester is from week 29 through week 42, months 7 through 9. The third trimester is a time when the fetus is growing rapidly. At the end of the ninth month, the fetus is about 20 inches in length and weighs 6-10 pounds.  BODY CHANGES Your body goes through many changes during pregnancy. The changes vary from woman to woman.   Your weight will continue to increase. You can expect to gain 25-35 pounds (11-16 kg) by the end of the pregnancy.  You may begin to get stretch marks on your hips, abdomen, and breasts.  You may urinate more often because the fetus is moving lower into your pelvis and pressing on your bladder.  You may develop or continue to have heartburn as a result of your pregnancy.  You may develop constipation because certain hormones are causing the muscles that push waste through your intestines to slow down.  You may develop hemorrhoids or swollen, bulging veins (varicose veins).  You may have pelvic pain because of the weight gain and pregnancy hormones relaxing your joints between the bones in your pelvis. Backaches may result from overexertion of the muscles supporting your posture.  You may have changes in your hair. These can include thickening of your hair, rapid growth, and changes in texture. Some women also have hair loss during or after pregnancy, or hair that feels dry or thin. Your hair will most likely return to normal after your baby is born.  Your breasts will continue to grow and be tender. A yellow discharge may leak from your breasts called colostrum.  Your belly button may stick out.  You may feel short of breath because of your expanding uterus.  You may notice the fetus "dropping," or moving lower in your abdomen.  You may have a bloody mucus discharge. This usually occurs a few days to a week before labor begins.  Your cervix becomes thin and soft (effaced) near your due date. WHAT TO EXPECT AT YOUR  PRENATAL EXAMS  You will have prenatal exams every 2 weeks until week 36. Then, you will have weekly prenatal exams. During a routine prenatal visit:  You will be weighed to make sure you and the fetus are growing normally.  Your blood pressure is taken.  Your abdomen will be measured to track your baby's growth.  The fetal heartbeat will be listened to.  Any test results from the previous visit will be discussed.  You may have a cervical check near your due date to see if you have effaced. At around 36 weeks, your caregiver will check your cervix. At the same time, your caregiver will also perform a test on the secretions of the vaginal tissue. This test is to determine if a type of bacteria, Group B streptococcus, is present. Your caregiver will explain this further. Your caregiver may ask you:  What your birth plan is.  How you are feeling.  If you are feeling the baby move.  If you have had any abnormal symptoms, such as leaking fluid, bleeding, severe headaches, or abdominal cramping.  If you are using any tobacco products, including cigarettes, chewing tobacco, and electronic cigarettes.  If you have any questions. Other tests or screenings that may be performed during your third trimester include:  Blood tests that check for low iron levels (anemia).  Fetal testing to check the health, activity level, and growth of the fetus. Testing is done if you have certain medical conditions or if   there are problems during the pregnancy.  HIV (human immunodeficiency virus) testing. If you are at high risk, you may be screened for HIV during your third trimester of pregnancy. FALSE LABOR You may feel small, irregular contractions that eventually go away. These are called Braxton Hicks contractions, or false labor. Contractions may last for hours, days, or even weeks before true labor sets in. If contractions come at regular intervals, intensify, or become painful, it is best to be seen  by your caregiver.  SIGNS OF LABOR   Menstrual-like cramps.  Contractions that are 5 minutes apart or less.  Contractions that start on the top of the uterus and spread down to the lower abdomen and back.  A sense of increased pelvic pressure or back pain.  A watery or bloody mucus discharge that comes from the vagina. If you have any of these signs before the 37th week of pregnancy, call your caregiver right away. You need to go to the hospital to get checked immediately. HOME CARE INSTRUCTIONS   Avoid all smoking, herbs, alcohol, and unprescribed drugs. These chemicals affect the formation and growth of the baby.  Do not use any tobacco products, including cigarettes, chewing tobacco, and electronic cigarettes. If you need help quitting, ask your health care provider. You may receive counseling support and other resources to help you quit.  Follow your caregiver's instructions regarding medicine use. There are medicines that are either safe or unsafe to take during pregnancy.  Exercise only as directed by your caregiver. Experiencing uterine cramps is a good sign to stop exercising.  Continue to eat regular, healthy meals.  Wear a good support bra for breast tenderness.  Do not use hot tubs, steam rooms, or saunas.  Wear your seat belt at all times when driving.  Avoid raw meat, uncooked cheese, cat litter boxes, and soil used by cats. These carry germs that can cause birth defects in the baby.  Take your prenatal vitamins.  Take 1500-2000 mg of calcium daily starting at the 20th week of pregnancy until you deliver your baby.  Try taking a stool softener (if your caregiver approves) if you develop constipation. Eat more high-fiber foods, such as fresh vegetables or fruit and whole grains. Drink plenty of fluids to keep your urine clear or pale yellow.  Take warm sitz baths to soothe any pain or discomfort caused by hemorrhoids. Use hemorrhoid cream if your caregiver  approves.  If you develop varicose veins, wear support hose. Elevate your feet for 15 minutes, 3-4 times a day. Limit salt in your diet.  Avoid heavy lifting, wear low heal shoes, and practice good posture.  Rest a lot with your legs elevated if you have leg cramps or low back pain.  Visit your dentist if you have not gone during your pregnancy. Use a soft toothbrush to brush your teeth and be gentle when you floss.  A sexual relationship may be continued unless your caregiver directs you otherwise.  Do not travel far distances unless it is absolutely necessary and only with the approval of your caregiver.  Take prenatal classes to understand, practice, and ask questions about the labor and delivery.  Make a trial run to the hospital.  Pack your hospital bag.  Prepare the baby's nursery.  Continue to go to all your prenatal visits as directed by your caregiver. SEEK MEDICAL CARE IF:  You are unsure if you are in labor or if your water has broken.  You have dizziness.  You have   mild pelvic cramps, pelvic pressure, or nagging pain in your abdominal area.  You have persistent nausea, vomiting, or diarrhea.  You have a bad smelling vaginal discharge.  You have pain with urination. SEEK IMMEDIATE MEDICAL CARE IF:   You have a fever.  You are leaking fluid from your vagina.  You have spotting or bleeding from your vagina.  You have severe abdominal cramping or pain.  You have rapid weight loss or gain.  You have shortness of breath with chest pain.  You notice sudden or extreme swelling of your face, hands, ankles, feet, or legs.  You have not felt your baby move in over an hour.  You have severe headaches that do not go away with medicine.  You have vision changes.   This information is not intended to replace advice given to you by your health care provider. Make sure you discuss any questions you have with your health care provider.   Document Released:  02/03/2001 Document Revised: 03/02/2014 Document Reviewed: 04/12/2012 Elsevier Interactive Patient Education 2016 [employer]  Breastfeeding Deciding to breastfeed is one of the best choices you can make for you and your baby. A change in hormones during pregnancy causes your breast tissue to grow and increases the number and size of your milk ducts. These hormones also allow proteins, sugars, and fats from your blood supply to make breast milk in your milk-producing glands. Hormones prevent breast milk from being released before your baby is born as well as prompt milk flow after birth. Once breastfeeding has begun, thoughts of your baby, as well as his or her sucking or crying, can stimulate the release of milk from your milk-producing glands.  BENEFITS OF BREASTFEEDING For Your Baby  Your first milk (colostrum) helps your baby's digestive system function better.  There are antibodies in your milk that help your baby fight off infections.  Your baby has a lower incidence of asthma, allergies, and sudden infant death syndrome.  The nutrients in breast milk are better for your baby than infant formulas and are designed uniquely for your baby's needs.  Breast milk improves your baby's brain development.  Your baby is less likely to develop other conditions, such as childhood obesity, asthma, or type 2 diabetes mellitus. For You  Breastfeeding helps to create a very special bond between you and your baby.  Breastfeeding is convenient. Breast milk is always available at the correct temperature and costs nothing.  Breastfeeding helps to burn calories and helps you lose the weight gained during pregnancy.  Breastfeeding makes your uterus contract to its prepregnancy size faster and slows bleeding (lochia) after you give birth.   Breastfeeding helps to lower your risk of developing type 2 diabetes mellitus, osteoporosis, and breast or ovarian cancer later in life. SIGNS THAT YOUR BABY IS  HUNGRY Early Signs of Hunger  Increased alertness or activity.  Stretching.  Movement of the head from side to side.  Movement of the head and opening of the mouth when the corner of the mouth or cheek is stroked (rooting).  Increased sucking sounds, smacking lips, cooing, sighing, or squeaking.  Hand-to-mouth movements.  Increased sucking of fingers or hands. Late Signs of Hunger  Fussing.  Intermittent crying. Extreme Signs of Hunger Signs of extreme hunger will require calming and consoling before your baby will be able to breastfeed successfully. Do not wait for the following signs of extreme hunger to occur before you initiate breastfeeding:  Restlessness.  A loud, strong cry.  Screaming.   BREASTFEEDING BASICS Breastfeeding Initiation  Find a comfortable place to sit or lie down, with your neck and back well supported.  Place a pillow or rolled up blanket under your baby to bring him or her to the level of your breast (if you are seated). Nursing pillows are specially designed to help support your arms and your baby while you breastfeed.  Make sure that your baby's abdomen is facing your abdomen.  Gently massage your breast. With your fingertips, massage from your chest wall toward your nipple in a circular motion. This encourages milk flow. You may need to continue this action during the feeding if your milk flows slowly.  Support your breast with 4 fingers underneath and your thumb above your nipple. Make sure your fingers are well away from your nipple and your baby's mouth.  Stroke your baby's lips gently with your finger or nipple.  When your baby's mouth is open wide enough, quickly bring your baby to your breast, placing your entire nipple and as much of the colored area around your nipple (areola) as possible into your baby's mouth.  More areola should be visible above your baby's upper lip than below the lower lip.  Your baby's tongue should be between his  or her lower gum and your breast.  Ensure that your baby's mouth is correctly positioned around your nipple (latched). Your baby's lips should create a seal on your breast and be turned out (everted).  It is common for your baby to suck about 2-3 minutes in order to start the flow of breast milk. Latching Teaching your baby how to latch on to your breast properly is very important. An improper latch can cause nipple pain and decreased milk supply for you and poor weight gain in your baby. Also, if your baby is not latched onto your nipple properly, he or she may swallow some air during feeding. This can make your baby fussy. Burping your baby when you switch breasts during the feeding can help to get rid of the air. However, teaching your baby to latch on properly is still the best way to prevent fussiness from swallowing air while breastfeeding. Signs that your baby has successfully latched on to your nipple:  Silent tugging or silent sucking, without causing you pain.  Swallowing heard between every 3-4 sucks.  Muscle movement above and in front of his or her ears while sucking. Signs that your baby has not successfully latched on to nipple:  Sucking sounds or smacking sounds from your baby while breastfeeding.  Nipple pain. If you think your baby has not latched on correctly, slip your finger into the corner of your baby's mouth to break the suction and place it between your baby's gums. Attempt breastfeeding initiation again. Signs of Successful Breastfeeding Signs from your baby:  A gradual decrease in the number of sucks or complete cessation of sucking.  Falling asleep.  Relaxation of his or her body.  Retention of a small amount of milk in his or her mouth.  Letting go of your breast by himself or herself. Signs from you:  Breasts that have increased in firmness, weight, and size 1-3 hours after feeding.  Breasts that are softer immediately after  breastfeeding.  Increased milk volume, as well as a change in milk consistency and color by the fifth day of breastfeeding.  Nipples that are not sore, cracked, or bleeding. Signs That Your Baby is Getting Enough Milk  Wetting at least 3 diapers in a 24-hour period.   The urine should be clear and pale yellow by age 5 days.  At least 3 stools in a 24-hour period by age 5 days. The stool should be soft and yellow.  At least 3 stools in a 24-hour period by age 7 days. The stool should be seedy and yellow.  No loss of weight greater than 10% of birth weight during the first 3 days of age.  Average weight gain of 4-7 ounces (113-198 g) per week after age 4 days.  Consistent daily weight gain by age 5 days, without weight loss after the age of 2 weeks. After a feeding, your baby may spit up a small amount. This is common. BREASTFEEDING FREQUENCY AND DURATION Frequent feeding will help you make more milk and can prevent sore nipples and breast engorgement. Breastfeed when you feel the need to reduce the fullness of your breasts or when your baby shows signs of hunger. This is called "breastfeeding on demand." Avoid introducing a pacifier to your baby while you are working to establish breastfeeding (the first 4-6 weeks after your baby is born). After this time you may choose to use a pacifier. Research has shown that pacifier use during the first year of a baby's life decreases the risk of sudden infant death syndrome (SIDS). Allow your baby to feed on each breast as long as he or she wants. Breastfeed until your baby is finished feeding. When your baby unlatches or falls asleep while feeding from the first breast, offer the second breast. Because newborns are often sleepy in the first few weeks of life, you may need to awaken your baby to get him or her to feed. Breastfeeding times will vary from baby to baby. However, the following rules can serve as a guide to help you ensure that your baby is  properly fed:  Newborns (babies 4 weeks of age or younger) may breastfeed every 1-3 hours.  Newborns should not go longer than 3 hours during the day or 5 hours during the night without breastfeeding.  You should breastfeed your baby a minimum of 8 times in a 24-hour period until you begin to introduce solid foods to your baby at around 6 months of age. BREAST MILK PUMPING Pumping and storing breast milk allows you to ensure that your baby is exclusively fed your breast milk, even at times when you are unable to breastfeed. This is especially important if you are going back to work while you are still breastfeeding or when you are not able to be present during feedings. Your lactation consultant can give you guidelines on how long it is safe to store breast milk. A breast pump is a machine that allows you to pump milk from your breast into a sterile bottle. The pumped breast milk can then be stored in a refrigerator or freezer. Some breast pumps are operated by hand, while others use electricity. Ask your lactation consultant which type will work best for you. Breast pumps can be purchased, but some hospitals and breastfeeding support groups lease breast pumps on a monthly basis. A lactation consultant can teach you how to hand express breast milk, if you prefer not to use a pump. CARING FOR YOUR BREASTS WHILE YOU BREASTFEED Nipples can become dry, cracked, and sore while breastfeeding. The following recommendations can help keep your breasts moisturized and healthy:  Avoid using soap on your nipples.  Wear a supportive bra. Although not required, special nursing bras and tank tops are designed to allow access to your   breasts for breastfeeding without taking off your entire bra or top. Avoid wearing underwire-style bras or extremely tight bras.  Air dry your nipples for 3-4minutes after each feeding.  Use only cotton bra pads to absorb leaked breast milk. Leaking of breast milk between feedings  is normal.  Use lanolin on your nipples after breastfeeding. Lanolin helps to maintain your skin's normal moisture barrier. If you use pure lanolin, you do not need to wash it off before feeding your baby again. Pure lanolin is not toxic to your baby. You may also hand express a few drops of breast milk and gently massage that milk into your nipples and allow the milk to air dry. In the first few weeks after giving birth, some women experience extremely full breasts (engorgement). Engorgement can make your breasts feel heavy, warm, and tender to the touch. Engorgement peaks within 3-5 days after you give birth. The following recommendations can help ease engorgement:  Completely empty your breasts while breastfeeding or pumping. You may want to start by applying warm, moist heat (in the shower or with warm water-soaked hand towels) just before feeding or pumping. This increases circulation and helps the milk flow. If your baby does not completely empty your breasts while breastfeeding, pump any extra milk after he or she is finished.  Wear a snug bra (nursing or regular) or tank top for 1-2 days to signal your body to slightly decrease milk production.  Apply ice packs to your breasts, unless this is too uncomfortable for you.  Make sure that your baby is latched on and positioned properly while breastfeeding. If engorgement persists after 48 hours of following these recommendations, contact your health care provider or a lactation consultant. OVERALL HEALTH CARE RECOMMENDATIONS WHILE BREASTFEEDING  Eat healthy foods. Alternate between meals and snacks, eating 3 of each per day. Because what you eat affects your breast milk, some of the foods may make your baby more irritable than usual. Avoid eating these foods if you are sure that they are negatively affecting your baby.  Drink milk, fruit juice, and water to satisfy your thirst (about 10 glasses a day).  Rest often, relax, and continue to take  your prenatal vitamins to prevent fatigue, stress, and anemia.  Continue breast self-awareness checks.  Avoid chewing and smoking tobacco. Chemicals from cigarettes that pass into breast milk and exposure to secondhand smoke may harm your baby.  Avoid alcohol and drug use, including marijuana. Some medicines that may be harmful to your baby can pass through breast milk. It is important to ask your health care provider before taking any medicine, including all over-the-counter and prescription medicine as well as vitamin and herbal supplements. It is possible to become pregnant while breastfeeding. If birth control is desired, ask your health care provider about options that will be safe for your baby. SEEK MEDICAL CARE IF:  You feel like you want to stop breastfeeding or have become frustrated with breastfeeding.  You have painful breasts or nipples.  Your nipples are cracked or bleeding.  Your breasts are red, tender, or warm.  You have a swollen area on either breast.  You have a fever or chills.  You have nausea or vomiting.  You have drainage other than breast milk from your nipples.  Your breasts do not become full before feedings by the fifth day after you give birth.  You feel sad and depressed.  Your baby is too sleepy to eat well.  Your baby is having trouble sleeping.     Your baby is wetting less than 3 diapers in a 24-hour period.  Your baby has less than 3 stools in a 24-hour period.  Your baby's skin or the white part of his or her eyes becomes yellow.   Your baby is not gaining weight by 5 days of age. SEEK IMMEDIATE MEDICAL CARE IF:  Your baby is overly tired (lethargic) and does not want to wake up and feed.  Your baby develops an unexplained fever.   This information is not intended to replace advice given to you by your health care provider. Make sure you discuss any questions you have with your health care provider.   Document Released: 02/09/2005  Document Revised: 10/31/2014 Document Reviewed: 08/03/2012 Elsevier Interactive Patient Education 2016 [employer]  

## 2015-05-13 NOTE — Progress Notes (Signed)
Declined Flu and TDAP

## 2015-05-13 NOTE — Progress Notes (Signed)
Subjective:  Debra Carr is a 35 y.o. G3P0020 at [redacted]w[redacted]d being seen today for ongoing prenatal care.  She is currently monitored for the following issues for this high-risk pregnancy and has Supervision of high risk pregnancy, antepartum; Chronic benign essential hypertension, antepartum; Advanced maternal age, primigravida, antepartum; Maternal obesity, antepartum; Anemia affecting pregnancy, antepartum; and Echogenic intracardiac focus of fetus on prenatal ultrasound on her problem list.  Patient reports no complaints.  Contractions: Not present. Vag. Bleeding: None.  Movement: Present. Denies leaking of fluid.   The following portions of the patient's history were reviewed and updated as appropriate: allergies, current medications, past family history, past medical history, past social history, past surgical history and problem list. Problem list updated.  Objective:   Filed Vitals:   05/13/15 0838  BP: 139/81  Pulse: 79  Temp: 98.7 F (37.1 C)  Weight: 248 lb 9.6 oz (112.764 kg)    Fetal Status: Fetal Heart Rate (bpm): 132 Fundal Height: 27 cm Movement: Present     General:  Alert, oriented and cooperative. Patient is in no acute distress.  Skin: Skin is warm and dry. No rash noted.   Cardiovascular: Normal heart rate noted  Respiratory: Normal respiratory effort, no problems with respiration noted  Abdomen: Soft, gravid, appropriate for gestational age. Pain/Pressure: Present     Pelvic: Vag. Bleeding: None     Cervical exam deferred        Extremities: Normal range of motion.  Edema: None  Mental Status: Normal mood and affect. Normal behavior. Normal judgment and thought content.   Urinalysis: Urine Protein: 1+ Urine Glucose: Negative  U/S 05/10/15 - vtx, 2 lb 2 oz (54%), AFI 13.6   Assessment and Plan:  Pregnancy: [id number] at [redacted]w[redacted]d  1. Supervision of high risk pregnancy, antepartum, second trimester\ - CBC - RPR - Glucose Tolerance, 1 HR (50g) - Tdap vaccine greater  than or equal to 7yo IM - HIV antibody  2. Chronic benign essential hypertension, antepartum Normal u/s for growth on 05/10/15--has f/u scheduled - BP looks good today Continue Labetalol and ASA   Preterm labor symptoms and general obstetric precautions including but not limited to vaginal bleeding, contractions, leaking of fluid and fetal movement were reviewed in detail with the patient. Please refer to After Visit Summary for other counseling recommendations.  Return in 2 weeks (on 05/27/2015).   Donnamae Jude, MD

## 2015-05-14 LAB — GLUCOSE TOLERANCE, 1 HOUR (50G) W/O FASTING: Glucose, 1 Hr, gestational: 98 mg/dL (ref <140)

## 2015-05-14 LAB — HIV ANTIBODY (ROUTINE TESTING W REFLEX): HIV 1&2 Ab, 4th Generation: NONREACTIVE

## 2015-05-15 LAB — RPR

## 2015-05-26 ENCOUNTER — Other Ambulatory Visit: Payer: Self-pay | Admitting: Family Medicine

## 2015-05-27 ENCOUNTER — Encounter: Payer: Medicaid Other | Admitting: Family Medicine

## 2015-05-27 LAB — POCT URINALYSIS DIP (DEVICE)
GLUCOSE, UA: NEGATIVE mg/dL
Hgb urine dipstick: NEGATIVE
Leukocytes, UA: NEGATIVE
Nitrite: NEGATIVE
PROTEIN: 30 mg/dL — AB
UROBILINOGEN UA: 4 mg/dL — AB (ref 0.0–1.0)
pH: 5.5 (ref 5.0–8.0)

## 2015-06-04 ENCOUNTER — Ambulatory Visit (INDEPENDENT_AMBULATORY_CARE_PROVIDER_SITE_OTHER): Payer: Medicaid Other | Admitting: Family

## 2015-06-04 ENCOUNTER — Ambulatory Visit (HOSPITAL_COMMUNITY): Admission: RE | Admit: 2015-06-04 | Payer: Medicaid Other | Source: Ambulatory Visit

## 2015-06-04 VITALS — BP 163/94 | HR 84 | Temp 98.8°F | Wt 246.6 lb

## 2015-06-04 DIAGNOSIS — O99213 Obesity complicating pregnancy, third trimester: Secondary | ICD-10-CM | POA: Diagnosis not present

## 2015-06-04 DIAGNOSIS — E669 Obesity, unspecified: Secondary | ICD-10-CM | POA: Diagnosis not present

## 2015-06-04 DIAGNOSIS — I10 Essential (primary) hypertension: Secondary | ICD-10-CM

## 2015-06-04 DIAGNOSIS — O10919 Unspecified pre-existing hypertension complicating pregnancy, unspecified trimester: Secondary | ICD-10-CM

## 2015-06-04 DIAGNOSIS — O10913 Unspecified pre-existing hypertension complicating pregnancy, third trimester: Secondary | ICD-10-CM

## 2015-06-04 DIAGNOSIS — O10019 Pre-existing essential hypertension complicating pregnancy, unspecified trimester: Secondary | ICD-10-CM

## 2015-06-04 DIAGNOSIS — O10912 Unspecified pre-existing hypertension complicating pregnancy, second trimester: Secondary | ICD-10-CM

## 2015-06-04 DIAGNOSIS — O0993 Supervision of high risk pregnancy, unspecified, third trimester: Secondary | ICD-10-CM

## 2015-06-04 LAB — POCT URINALYSIS DIP (DEVICE)
GLUCOSE, UA: NEGATIVE mg/dL
HGB URINE DIPSTICK: NEGATIVE
Ketones, ur: NEGATIVE mg/dL
LEUKOCYTES UA: NEGATIVE
NITRITE: NEGATIVE
Protein, ur: 30 mg/dL — AB
Specific Gravity, Urine: 1.02 (ref 1.005–1.030)
UROBILINOGEN UA: 4 mg/dL — AB (ref 0.0–1.0)
pH: 6.5 (ref 5.0–8.0)

## 2015-06-04 NOTE — Addendum Note (Signed)
Addended by: Langston Reusing on: 06/04/2015 10:25 AM   Modules accepted: Orders

## 2015-06-04 NOTE — Addendum Note (Signed)
Addended by: Phillip Heal, DEMETRICE A on: 06/04/2015 09:38 AM   Modules accepted: Orders

## 2015-06-04 NOTE — Progress Notes (Signed)
Subjective:  Debra Carr is a 35 y.o. G3P0020 at [redacted]w[redacted]d being seen today for ongoing prenatal care.  She is currently monitored for the following issues for this high-risk pregnancy and has Supervision of high risk pregnancy, antepartum; Chronic benign essential hypertension, antepartum; Advanced maternal age, primigravida, antepartum; Maternal obesity, antepartum; Anemia affecting pregnancy, antepartum; and Echogenic intracardiac focus of fetus on prenatal ultrasound on her problem list.  Patient reports new onset of daily headaches x one week.  Denies vision changes or epigastric.  Contractions: Not present. Vag. Bleeding: None.  Movement: Present. Denies leaking of fluid.   The following portions of the patient's history were reviewed and updated as appropriate: allergies, current medications, past family history, past medical history, past social history, past surgical history and problem list. Problem list updated.  Objective:   Filed Vitals:   06/04/15 0859 06/04/15 0902  BP: 159/95 163/94  Pulse: 84   Temp: 98.8 F (37.1 C)   Weight: 246 lb 9.6 oz (111.857 kg)     Fetal Status: Fetal Heart Rate (bpm): 140 Fundal Height: 32 cm Movement: Present     General:  Alert, oriented and cooperative. Patient is in no acute distress.  Skin: Skin is warm and dry. No rash noted.   Cardiovascular: Normal heart rate noted  Respiratory: Normal respiratory effort, no problems with respiration noted  Abdomen: Soft, gravid, appropriate for gestational age. Pain/Pressure: Absent     Pelvic: Vag. Bleeding: None     Cervical exam deferred        Extremities: Normal range of motion.  Edema: None  Mental Status: Normal mood and affect. Normal behavior. Normal judgment and thought content.   Urinalysis: Urine Protein: 1+ Urine Glucose: Negative  Assessment and Plan:  Pregnancy: G3P0020 at [redacted]w[redacted]d  1. Chronic hypertension during pregnancy, antepartum - US FETAL BPP WO NON STRESS; Future > begin  weekly BPP until 32 weeks (add to growth [place] on Friday)  2. Chronic benign essential hypertension, antepartum - Increase labetalol to 600 mg BID - PreX eval in MAU today  3. Supervision of high risk pregnancy, antepartum, third trimester - Transfer to [facility]   4. Maternal obesity, antepartum, third trimester - Continue monitoring  Preterm labor symptoms and general obstetric precautions including but not limited to vaginal bleeding, contractions, leaking of fluid and fetal movement were reviewed in detail with the patient. Please refer to After Visit Summary for other counseling recommendations.  Return in about 1 week (around 06/11/2015) for [facility] - Thursday.   Venia Carbon Michiel Cowboy, CNM

## 2015-06-04 NOTE — Progress Notes (Signed)
Breastfeeding tip of the week reviewed Pt c/o daily headaches

## 2015-06-07 ENCOUNTER — Ambulatory Visit (HOSPITAL_COMMUNITY)
Admission: RE | Admit: 2015-06-07 | Discharge: 2015-06-07 | Disposition: A | Discharge status: Home / Self Care | Payer: Medicaid Other | Source: Ambulatory Visit | Attending: Maternal and Fetal Medicine | Admitting: Maternal and Fetal Medicine

## 2015-06-07 ENCOUNTER — Other Ambulatory Visit: Payer: Self-pay | Admitting: Family

## 2015-06-07 ENCOUNTER — Encounter (HOSPITAL_COMMUNITY): Payer: Self-pay

## 2015-06-07 DIAGNOSIS — O10919 Unspecified pre-existing hypertension complicating pregnancy, unspecified trimester: Secondary | ICD-10-CM

## 2015-06-07 DIAGNOSIS — Z3A30 30 weeks gestation of pregnancy: Secondary | ICD-10-CM

## 2015-06-07 DIAGNOSIS — O10019 Pre-existing essential hypertension complicating pregnancy, unspecified trimester: Secondary | ICD-10-CM

## 2015-06-07 DIAGNOSIS — O09523 Supervision of elderly multigravida, third trimester: Secondary | ICD-10-CM | POA: Diagnosis not present

## 2015-06-07 DIAGNOSIS — O10013 Pre-existing essential hypertension complicating pregnancy, third trimester: Secondary | ICD-10-CM | POA: Diagnosis present

## 2015-06-07 DIAGNOSIS — O99213 Obesity complicating pregnancy, third trimester: Secondary | ICD-10-CM | POA: Insufficient documentation

## 2015-06-07 DIAGNOSIS — O1003 Pre-existing essential hypertension complicating the puerperium: Secondary | ICD-10-CM | POA: Diagnosis not present

## 2015-06-11 ENCOUNTER — Ambulatory Visit (INDEPENDENT_AMBULATORY_CARE_PROVIDER_SITE_OTHER): Payer: Medicaid Other | Admitting: Student

## 2015-06-11 VITALS — BP 144/97 | HR 82 | Wt 248.6 lb

## 2015-06-11 DIAGNOSIS — O09512 Supervision of elderly primigravida, second trimester: Secondary | ICD-10-CM

## 2015-06-11 DIAGNOSIS — O09513 Supervision of elderly primigravida, third trimester: Secondary | ICD-10-CM

## 2015-06-11 DIAGNOSIS — O10913 Unspecified pre-existing hypertension complicating pregnancy, third trimester: Secondary | ICD-10-CM

## 2015-06-11 LAB — COMPREHENSIVE METABOLIC PANEL
ALT: 13 U/L (ref 6–29)
AST: 20 U/L (ref 10–30)
Albumin: 3.3 g/dL — ABNORMAL LOW (ref 3.6–5.1)
Alkaline Phosphatase: 60 U/L (ref 33–115)
BILIRUBIN TOTAL: 0.6 mg/dL (ref 0.2–1.2)
BUN: 5 mg/dL — ABNORMAL LOW (ref 7–25)
CO2: 21 mmol/L (ref 20–31)
CREATININE: 0.52 mg/dL (ref 0.50–1.10)
Calcium: 8.6 mg/dL (ref 8.6–10.2)
Chloride: 105 mmol/L (ref 98–110)
GLUCOSE: 73 mg/dL (ref 65–99)
Potassium: 3.5 mmol/L (ref 3.5–5.3)
SODIUM: 137 mmol/L (ref 135–146)
Total Protein: 6.4 g/dL (ref 6.1–8.1)

## 2015-06-11 LAB — CBC
HCT: 29.8 % — ABNORMAL LOW (ref 35.0–45.0)
HEMOGLOBIN: 9.6 g/dL — AB (ref 11.7–15.5)
MCH: 30.3 pg (ref 27.0–33.0)
MCHC: 32.2 g/dL (ref 32.0–36.0)
MCV: 94 fL (ref 80.0–100.0)
MPV: 9.1 fL (ref 7.5–12.5)
Platelets: 281 10*3/uL (ref 140–400)
RBC: 3.17 MIL/uL — ABNORMAL LOW (ref 3.80–5.10)
RDW: 13.3 % (ref 11.0–15.0)
WBC: 10.3 10*3/uL (ref 3.8–10.8)

## 2015-06-11 LAB — POCT URINALYSIS DIP (DEVICE)
Bilirubin Urine: NEGATIVE
Glucose, UA: NEGATIVE mg/dL
HGB URINE DIPSTICK: NEGATIVE
Ketones, ur: NEGATIVE mg/dL
Leukocytes, UA: NEGATIVE
NITRITE: NEGATIVE
PH: 6.5 (ref 5.0–8.0)
Protein, ur: NEGATIVE mg/dL
Specific Gravity, Urine: 1.015 (ref 1.005–1.030)
UROBILINOGEN UA: 2 mg/dL — AB (ref 0.0–1.0)

## 2015-06-11 MED ORDER — LABETALOL HCL 300 MG PO TABS: 600.0000 mg | ORAL_TABLET | Freq: Two times a day (BID) | ORAL | Status: DC

## 2015-06-11 MED ORDER — LABETALOL HCL 300 MG PO TABS: 300.0000 mg | ORAL_TABLET | Freq: Two times a day (BID) | ORAL | Status: DC

## 2015-06-11 NOTE — Progress Notes (Addendum)
  Subjective:  Debra Carr is a 35 y.o. G3P0020 at 46w1dbeing seen today for ongoing prenatal care.  She is currently monitored for the following issues for this high-risk pregnancy and has Supervision of high risk pregnancy, antepartum; Chronic benign essential hypertension, antepartum; Advanced maternal age, primigravida, antepartum; Maternal obesity, antepartum; and Anemia affecting pregnancy, antepartum on her problem list.  Patient reports no complaints.  Contractions: Not present. Vag. Bleeding: None.  Movement: Present. Denies leaking of fluid.  Per review of previous visit note: labetalol dose increased to '600mg'$  BID & was sent to MAU for for pre eclampsia evaluation. Patient states she hasn't increased her labetalol dosage yet b/c she was running out of pills & hadn't gotten her refill. No noted in epic regarding an MAU visit last week. Patient adamant that she was seen in MAU for BP evaluation & labs last week after her prenatal visit.  Denies headache, vision changes, epigastric pain, n/v, chest pain, or SOB.   The following portions of the patient's history were reviewed and updated as appropriate: allergies, current medications, past family history, past medical history, past social history, past surgical history and problem list. Problem list updated.  Objective:   Filed Vitals:   06/11/15 0848  BP: 144/97  Pulse: 82  Weight: 248 lb 9.6 oz (112.764 kg)    Fetal Status: Fetal Heart Rate (bpm): 135 Fundal Height: 34 cm Movement: Present     General:  Alert, oriented and cooperative. Patient is in no acute distress.  Skin: Skin is warm and dry. No rash noted.   Cardiovascular: Normal heart rate noted  Respiratory: Normal respiratory effort, no problems with respiration noted  Abdomen: Soft, gravid, appropriate for gestational age. Fundal height 34 cm    Pelvic: Vag. Bleeding: None     Cervical exam deferred        Extremities: Normal range of motion.   DTR normal, no  clonus  Mental Status: Normal mood and affect. Normal behavior. Normal judgment and thought content.   Urinalysis:     Negative protein  Assessment and Plan:  Pregnancy: [id number] 330w1d1. Advanced maternal age, primigravida, antepartum, second trimester  - labetalol (NORMODYNE) 300 MG tablet; Take 2 tablets (600 mg total) by mouth 2 (two) times daily.  Dispense: 120 tablet; Refill: 1  2. Chronic hypertension in pregnancy, third trimester  - CBC - Comp Met (CMET)   Preterm labor symptoms, hypertension, and general obstetric precautions including but not limited to vaginal bleeding, contractions, leaking of fluid and fetal movement were reviewed in detail with the patient. Please refer to After Visit Summary for other counseling recommendations.  Return in about 1 week (around 06/18/2015) for twice weekly NST starting next week.    ErJorje GuildNP

## 2015-06-11 NOTE — Patient Instructions (Signed)

## 2015-06-13 ENCOUNTER — Emergency Department (HOSPITAL_BASED_OUTPATIENT_CLINIC_OR_DEPARTMENT_OTHER)
Admission: EM | Admit: 2015-06-13 | Discharge: 2015-06-13 | Disposition: A | Discharge status: Home / Self Care | Payer: Medicaid Other | Attending: Emergency Medicine | Admitting: Emergency Medicine

## 2015-06-13 ENCOUNTER — Encounter (HOSPITAL_BASED_OUTPATIENT_CLINIC_OR_DEPARTMENT_OTHER): Payer: Self-pay | Admitting: *Deleted

## 2015-06-13 DIAGNOSIS — O10013 Pre-existing essential hypertension complicating pregnancy, third trimester: Secondary | ICD-10-CM | POA: Insufficient documentation

## 2015-06-13 DIAGNOSIS — O9989 Other specified diseases and conditions complicating pregnancy, childbirth and the puerperium: Secondary | ICD-10-CM | POA: Diagnosis present

## 2015-06-13 DIAGNOSIS — Z79899 Other long term (current) drug therapy: Secondary | ICD-10-CM | POA: Diagnosis not present

## 2015-06-13 DIAGNOSIS — O2313 Infections of bladder in pregnancy, third trimester: Secondary | ICD-10-CM | POA: Insufficient documentation

## 2015-06-13 DIAGNOSIS — R300 Dysuria: Secondary | ICD-10-CM | POA: Diagnosis not present

## 2015-06-13 DIAGNOSIS — Z7982 Long term (current) use of aspirin: Secondary | ICD-10-CM | POA: Insufficient documentation

## 2015-06-13 DIAGNOSIS — Z3A31 31 weeks gestation of pregnancy: Secondary | ICD-10-CM | POA: Insufficient documentation

## 2015-06-13 DIAGNOSIS — Z8639 Personal history of other endocrine, nutritional and metabolic disease: Secondary | ICD-10-CM | POA: Insufficient documentation

## 2015-06-13 DIAGNOSIS — O26893 Other specified pregnancy related conditions, third trimester: Secondary | ICD-10-CM

## 2015-06-13 DIAGNOSIS — N3000 Acute cystitis without hematuria: Secondary | ICD-10-CM

## 2015-06-13 LAB — URINALYSIS, ROUTINE W REFLEX MICROSCOPIC
Glucose, UA: NEGATIVE mg/dL
HGB URINE DIPSTICK: NEGATIVE
Ketones, ur: 15 mg/dL — AB
Nitrite: NEGATIVE
Protein, ur: 30 mg/dL — AB
SPECIFIC GRAVITY, URINE: 1.027 (ref 1.005–1.030)
pH: 8 (ref 5.0–8.0)

## 2015-06-13 LAB — URINE MICROSCOPIC-ADD ON

## 2015-06-13 LAB — RAPID HIV SCREEN (HIV 1/2 AB+AG)
HIV 1/2 ANTIBODIES: NONREACTIVE
HIV-1 P24 ANTIGEN - HIV24: NONREACTIVE

## 2015-06-13 MED ORDER — NITROFURANTOIN MONOHYD MACRO 100 MG PO CAPS: 100.0000 mg | ORAL_CAPSULE | Freq: Two times a day (BID) | ORAL | Status: DC

## 2015-06-13 MED ORDER — AZITHROMYCIN 250 MG PO TABS
1000.0000 mg | ORAL_TABLET | Freq: Once | ORAL | Status: AC
  Administered 2015-06-13: 1000 mg via ORAL
  Filled 2015-06-13: qty 4

## 2015-06-13 MED ORDER — CEFTRIAXONE SODIUM 250 MG IJ SOLR
250.0000 mg | Freq: Once | INTRAMUSCULAR | Status: AC
  Administered 2015-06-13: 250 mg via INTRAMUSCULAR
  Filled 2015-06-13: qty 250

## 2015-06-13 MED ORDER — LIDOCAINE HCL (PF) 1 % IJ SOLN
INTRAMUSCULAR | Status: AC
  Administered 2015-06-13: 0.9 mL
  Filled 2015-06-13: qty 5

## 2015-06-13 MED FILL — NITROFURANTOIN MONO-MCR 100: 100 | 7 days supply | Qty: 14 | Fill #0

## 2015-06-13 NOTE — Discharge Instructions (Signed)
You have been seen today for urinary discomfort. Your urine shows evidence of a possible UTI. You have also been treated prophylactically for STDs with Rocephin and azithromycin. Be sure to keep all upcoming appointments with your OB/GYN. Follow up with PCP as needed. Return to ED should symptoms worsen.

## 2015-06-13 NOTE — ED Notes (Signed)
PA at bedside.

## 2015-06-13 NOTE — ED Notes (Signed)
Dysuria. States she was exposed to an STD. She is [redacted] weeks pregnant. No complaints with the pregnancy.

## 2015-06-13 NOTE — ED Provider Notes (Signed)
CSN: [id number]     Arrival date & time 06/13/15  1247 History   First MD Initiated Contact with Patient 06/13/15 1350     Chief Complaint  Patient presents with  . Dysuria     (Consider location/radiation/quality/duration/timing/severity/associated sxs/prior Treatment) HPI   Debra Carr is a 35 y.o. female, with a history of Hypertension and ectopic pregnancy, presenting to the ED with Dysuria that began yesterday. Patient states that her last sexual contact with her partner was on Monday, April 17. Yesterday, patient told her partner that she was having some urinary discomfort, he went to the health Department to be tested, was diagnosed with nongonococcal urethritis and treated prophylactically with azithromycin. Patient states that she is [redacted] weeks pregnant, complicated by age and gestational hypertension. G3P0. Patient denies abnormal vaginal discharge or bleeding, abdominal pain, fever/chills, nausea/vomiting, or any other complaints. Patient adds that she has an upcoming appointment at [facility] clinic on Monday, April 24.    Past Medical History  Diagnosis Date  . Hypertension   . Ectopic pregnancy   . Thyroid disorder    Past Surgical History  Procedure Laterality Date  . Laparoscopy     Family History  Problem Relation Age of Onset  . Hypertension Mother    Social History  Substance Use Topics  . Smoking status: Never Smoker   . Smokeless tobacco: None  . Alcohol Use: No   OB History    Gravida Para Term Preterm AB TAB SAB Ectopic Multiple Living   3    2  1 1   0     Review of Systems  Constitutional: Negative for fever and chills.  Gastrointestinal: Negative for nausea, vomiting and abdominal pain.  Genitourinary: Positive for dysuria. Negative for hematuria, vaginal bleeding and vaginal discharge.  All other systems reviewed and are negative.     Allergies  Review of patient's allergies indicates no known allergies.  Home Medications   Prior to  Admission medications   Medication Sig Start Date End Date Taking? Authorizing Provider  aspirin 81 MG tablet Take 1 tablet (81 mg total) by mouth daily. 04/04/15   Woodroe Mode, MD  ferrous sulfate 325 (65 FE) MG tablet Take 1 tablet (325 mg total) by mouth daily with breakfast. 04/18/15   Gwen Pounds, CNM  labetalol (NORMODYNE) 300 MG tablet Take 2 tablets (600 mg total) by mouth 2 (two) times daily. 06/11/15   Jorje Guild, NP  nitrofurantoin, macrocrystal-monohydrate, (MACROBID) 100 MG capsule Take 1 capsule (100 mg total) by mouth 2 (two) times daily. 06/13/15   Lorayne Bender, PA-C  Prenatal Vit-Fe Fumarate-FA (PRENATAL MULTIVITAMIN) TABS tablet Take 1 tablet by mouth daily at 12 noon. 03/08/15   Caren Macadam, MD  Prenatal Vit-Fe Fumarate-FA (PREPLUS) 27-1 MG TABS TAKE 1 TABLET BY MOUTH ONCE 05/27/15   Caren Macadam, MD   BP 179/96 mmHg  Pulse 79  Temp(Src) 98.1 F (36.7 C) (Oral)  Resp 18  Ht 5\' 2"  (1.575 m)  Wt 112.492 kg  BMI 45.35 kg/m2  SpO2 98%  LMP 11/05/2014 (Exact Date) Physical Exam  Constitutional: She appears well-developed and well-nourished. No distress.  HENT:  Head: Normocephalic and atraumatic.  Eyes: Conjunctivae are normal. Pupils are equal, round, and reactive to light.  Neck: Neck supple.  Cardiovascular: Normal rate, regular rhythm, normal heart sounds and intact distal pulses.   Pulmonary/Chest: Effort normal and breath sounds normal. No respiratory distress.  Abdominal: Soft. There is no tenderness. There is no  guarding.  Gravid abdomen. No tenderness. Fetal Doppler performed.  Musculoskeletal: She exhibits no edema or tenderness.  Lymphadenopathy:    She has no cervical adenopathy.  Neurological: She is alert.  Skin: Skin is warm and dry. She is not diaphoretic.  Psychiatric: She has a normal mood and affect. Her behavior is normal.  Nursing note and vitals reviewed.   ED Course  Procedures (including critical care time) Labs  Review Labs Reviewed  URINALYSIS, ROUTINE W REFLEX MICROSCOPIC (NOT AT [facility]) - Abnormal; Notable for the following:    Color, Urine ORANGE (*)    APPearance CLOUDY (*)    Bilirubin Urine SMALL (*)    Ketones, ur 15 (*)    Protein, ur 30 (*)    Leukocytes, UA SMALL (*)    All other components within normal limits  URINE MICROSCOPIC-ADD ON - Abnormal; Notable for the following:    Squamous Epithelial / LPF 6-30 (*)    Bacteria, UA MANY (*)    All other components within normal limits  URINE CULTURE  RPR  RAPID HIV SCREEN (HIV 1/2 AB+AG)  GC/CHLAMYDIA PROBE AMP ([place]) NOT AT [facility]    Imaging Review No results found. I have personally reviewed and evaluated these lab results as part of my medical decision-making.   EKG Interpretation None       Orders Placed This Encounter  Procedures  . Urine culture  . Urinalysis, Routine w reflex microscopic (not at [facility])  . RPR  . Rapid HIV screen (HIV 1/2 Ab+Ag)  . Urine microscopic-add on    MDM   Final diagnoses:  Acute cystitis without hematuria  Dysuria during pregnancy, third trimester    Debra Carr presents with dysuria that began yesterday, as well as a possible exposure to STDs.  Findings and plan of care discussed with Orlie Dakin, MD.   Patient's presentation and UA results give evidence for UTI. Patient also treated prophylactically for GC and chlamydia. Macrobid prescription upon discharge. Patient encouraged to keep her appointment on Monday, April 24. Return precautions discussed. Patient voices understanding of these instructions, accepts the plan, and is comfortable with discharge.  Fetal heart tones auscultated with Doppler. Fetal heart rate 120 bpm, normal.  Filed Vitals:   06/13/15 1258 06/13/15 1521  BP: 152/97 179/96  Pulse: 78 79  Temp: 98.4 F (36.9 C) 98.1 F (36.7 C)  TempSrc: Oral Oral  Resp: 20 18  Height: 5\' 2"  (1.575 m)   Weight: 112.492 kg   SpO2: 100% 98%     Lorayne Bender, PA-C 06/13/15 [place], MD 06/13/15 1700

## 2015-06-14 ENCOUNTER — Encounter (HOSPITAL_COMMUNITY): Payer: Self-pay

## 2015-06-14 ENCOUNTER — Ambulatory Visit (HOSPITAL_COMMUNITY)
Admission: RE | Admit: 2015-06-14 | Discharge: 2015-06-14 | Disposition: A | Discharge status: Home / Self Care | Payer: Medicaid Other | Source: Ambulatory Visit | Attending: Family | Admitting: Family

## 2015-06-14 ENCOUNTER — Other Ambulatory Visit: Payer: Self-pay | Admitting: Family

## 2015-06-14 VITALS — BP 146/89 | HR 77 | Wt 247.6 lb

## 2015-06-14 DIAGNOSIS — O09523 Supervision of elderly multigravida, third trimester: Secondary | ICD-10-CM | POA: Insufficient documentation

## 2015-06-14 DIAGNOSIS — Z3A31 31 weeks gestation of pregnancy: Secondary | ICD-10-CM | POA: Insufficient documentation

## 2015-06-14 DIAGNOSIS — O10913 Unspecified pre-existing hypertension complicating pregnancy, third trimester: Secondary | ICD-10-CM | POA: Diagnosis not present

## 2015-06-14 DIAGNOSIS — O10019 Pre-existing essential hypertension complicating pregnancy, unspecified trimester: Secondary | ICD-10-CM

## 2015-06-14 DIAGNOSIS — O10919 Unspecified pre-existing hypertension complicating pregnancy, unspecified trimester: Secondary | ICD-10-CM

## 2015-06-14 DIAGNOSIS — O99213 Obesity complicating pregnancy, third trimester: Secondary | ICD-10-CM

## 2015-06-14 DIAGNOSIS — E669 Obesity, unspecified: Secondary | ICD-10-CM | POA: Insufficient documentation

## 2015-06-14 LAB — RPR: RPR: NONREACTIVE

## 2015-06-14 LAB — URINE CULTURE: Culture: 7000 — AB

## 2015-06-14 LAB — GC/CHLAMYDIA PROBE AMP (~~LOC~~) NOT AT ARMC
CHLAMYDIA, DNA PROBE: NEGATIVE
NEISSERIA GONORRHEA: NEGATIVE

## 2015-06-17 ENCOUNTER — Other Ambulatory Visit: Payer: Medicaid Other

## 2015-06-20 ENCOUNTER — Ambulatory Visit (INDEPENDENT_AMBULATORY_CARE_PROVIDER_SITE_OTHER): Payer: Medicaid Other | Admitting: Family Medicine

## 2015-06-20 VITALS — BP 162/93 | HR 78 | Wt 246.5 lb

## 2015-06-20 DIAGNOSIS — O10013 Pre-existing essential hypertension complicating pregnancy, third trimester: Secondary | ICD-10-CM

## 2015-06-20 DIAGNOSIS — O0993 Supervision of high risk pregnancy, unspecified, third trimester: Secondary | ICD-10-CM

## 2015-06-20 DIAGNOSIS — O10019 Pre-existing essential hypertension complicating pregnancy, unspecified trimester: Secondary | ICD-10-CM

## 2015-06-20 DIAGNOSIS — O09513 Supervision of elderly primigravida, third trimester: Secondary | ICD-10-CM

## 2015-06-20 DIAGNOSIS — O2343 Unspecified infection of urinary tract in pregnancy, third trimester: Secondary | ICD-10-CM

## 2015-06-20 DIAGNOSIS — O09512 Supervision of elderly primigravida, second trimester: Secondary | ICD-10-CM

## 2015-06-20 DIAGNOSIS — Z36 Encounter for antenatal screening of mother: Secondary | ICD-10-CM | POA: Diagnosis not present

## 2015-06-20 LAB — POCT URINALYSIS DIP (DEVICE)
GLUCOSE, UA: NEGATIVE mg/dL
Hgb urine dipstick: NEGATIVE
Ketones, ur: 15 mg/dL — AB
Nitrite: POSITIVE — AB
Protein, ur: 30 mg/dL — AB
SPECIFIC GRAVITY, URINE: 1.02 (ref 1.005–1.030)
Urobilinogen, UA: 4 mg/dL — ABNORMAL HIGH (ref 0.0–1.0)
pH: 6.5 (ref 5.0–8.0)

## 2015-06-20 MED ORDER — CEFADROXIL 500 MG PO CAPS
500.0000 mg | ORAL_CAPSULE | Freq: Two times a day (BID) | ORAL | Status: DC
Start: 2015-06-20 — End: 2015-07-04

## 2015-06-20 MED ORDER — LABETALOL HCL 200 MG PO TABS: 400.0000 mg | ORAL_TABLET | Freq: Three times a day (TID) | ORAL | Status: DC

## 2015-06-20 NOTE — Patient Instructions (Signed)

## 2015-06-20 NOTE — Progress Notes (Signed)
Pt denies H/A or visual disturbances. Positive Nitrite on udip today.  Pt states she is on last day of Macrobid Rx.

## 2015-06-20 NOTE — Progress Notes (Signed)
Subjective:  Debra Carr is a 35 y.o. G3P0020 at [redacted]w[redacted]d being seen today for ongoing prenatal care.  She is currently monitored for the following issues for this high-risk pregnancy and has Supervision of high risk pregnancy, antepartum; Chronic benign essential hypertension, antepartum; Advanced maternal age, primigravida, antepartum; Maternal obesity, antepartum; and Anemia affecting pregnancy, antepartum on her problem list.  Patient reports dysuria.  Has been on Macrobid, but this hasn't worked.  Culture neg.  Now was nitrites in urine. Didn't take BP med this AM..   .  .   . Denies leaking of fluid.   The following portions of the patient's history were reviewed and updated as appropriate: allergies, current medications, past family history, past medical history, past social history, past surgical history and problem list. Problem list updated.  Objective:   Filed Vitals:   06/20/15 0908  BP: 162/93  Pulse: 78  Weight: 246 lb 8 oz (111.812 kg)    Fetal Status: Fetal Heart Rate (bpm): NST         General:  Alert, oriented and cooperative. Patient is in no acute distress.  Skin: Skin is warm and dry. No rash noted.   Cardiovascular: Normal heart rate noted  Respiratory: Normal respiratory effort, no problems with respiration noted  Abdomen: Soft, gravid, appropriate for gestational age.       Pelvic:       Cervical exam deferred        Extremities: Normal range of motion.     Mental Status: Normal mood and affect. Normal behavior. Normal judgment and thought content.   Urinalysis: Urine Protein: 1+ Urine Glucose: Negative  Assessment and Plan:  Pregnancy: G3P0020 at [redacted]w[redacted]d  1. Supervision of high risk pregnancy, antepartum, third trimester FHT normal - Urine Culture  2. Chronic benign essential hypertension, antepartum NST normal - Amniotic fluid index with NST  3. Advanced maternal age, primigravida, antepartum, second trimester Mother to go home and take medication  and check BP. Having HA with 600mg  BID.  Will change to 400mg  TID.  - labetalol (NORMODYNE) 200 MG tablet; Take 2 tablets (400 mg total) by mouth 3 (three) times daily.  Dispense: 180 tablet; Refill: 3  4.  UTI in pregnancy  Preterm labor symptoms and general obstetric precautions including but not limited to vaginal bleeding, contractions, leaking of fluid and fetal movement were reviewed in detail with the patient. Please refer to After Visit Summary for other counseling recommendations.  Return in about 4 days (around 06/24/2015) for as scheduled.   Truett Mainland, DO

## 2015-06-21 LAB — URINE CULTURE
Colony Count: NO GROWTH
Organism ID, Bacteria: NO GROWTH

## 2015-06-24 ENCOUNTER — Ambulatory Visit (INDEPENDENT_AMBULATORY_CARE_PROVIDER_SITE_OTHER): Payer: Medicaid Other | Admitting: General Practice

## 2015-06-24 VITALS — BP 152/89 | HR 78

## 2015-06-24 DIAGNOSIS — O09513 Supervision of elderly primigravida, third trimester: Secondary | ICD-10-CM | POA: Diagnosis present

## 2015-06-24 DIAGNOSIS — O10019 Pre-existing essential hypertension complicating pregnancy, unspecified trimester: Secondary | ICD-10-CM

## 2015-06-24 DIAGNOSIS — O10013 Pre-existing essential hypertension complicating pregnancy, third trimester: Secondary | ICD-10-CM

## 2015-06-24 NOTE — Progress Notes (Signed)
NST reactive.

## 2015-06-27 ENCOUNTER — Encounter (HOSPITAL_COMMUNITY): Payer: Self-pay

## 2015-06-27 ENCOUNTER — Ambulatory Visit (INDEPENDENT_AMBULATORY_CARE_PROVIDER_SITE_OTHER): Payer: Medicaid Other | Admitting: Family Medicine

## 2015-06-27 ENCOUNTER — Other Ambulatory Visit: Payer: Self-pay | Admitting: Obstetrics & Gynecology

## 2015-06-27 ENCOUNTER — Ambulatory Visit (HOSPITAL_COMMUNITY)
Admission: RE | Admit: 2015-06-27 | Discharge: 2015-06-27 | Disposition: A | Discharge status: Home / Self Care | Payer: Medicaid Other | Source: Ambulatory Visit | Attending: Obstetrics & Gynecology | Admitting: Obstetrics & Gynecology

## 2015-06-27 VITALS — BP 151/103 | HR 81 | Wt 251.5 lb

## 2015-06-27 VITALS — BP 134/76 | HR 84 | Wt 251.2 lb

## 2015-06-27 DIAGNOSIS — O10019 Pre-existing essential hypertension complicating pregnancy, unspecified trimester: Secondary | ICD-10-CM

## 2015-06-27 DIAGNOSIS — O99013 Anemia complicating pregnancy, third trimester: Secondary | ICD-10-CM

## 2015-06-27 DIAGNOSIS — E669 Obesity, unspecified: Secondary | ICD-10-CM

## 2015-06-27 DIAGNOSIS — O10013 Pre-existing essential hypertension complicating pregnancy, third trimester: Secondary | ICD-10-CM | POA: Insufficient documentation

## 2015-06-27 DIAGNOSIS — Z3A33 33 weeks gestation of pregnancy: Secondary | ICD-10-CM

## 2015-06-27 DIAGNOSIS — O09523 Supervision of elderly multigravida, third trimester: Secondary | ICD-10-CM | POA: Diagnosis not present

## 2015-06-27 DIAGNOSIS — D649 Anemia, unspecified: Secondary | ICD-10-CM

## 2015-06-27 DIAGNOSIS — O09513 Supervision of elderly primigravida, third trimester: Secondary | ICD-10-CM

## 2015-06-27 DIAGNOSIS — O99019 Anemia complicating pregnancy, unspecified trimester: Secondary | ICD-10-CM

## 2015-06-27 DIAGNOSIS — O99213 Obesity complicating pregnancy, third trimester: Secondary | ICD-10-CM | POA: Diagnosis not present

## 2015-06-27 DIAGNOSIS — O0993 Supervision of high risk pregnancy, unspecified, third trimester: Secondary | ICD-10-CM

## 2015-06-27 LAB — POCT URINALYSIS DIP (DEVICE)
Glucose, UA: NEGATIVE mg/dL
Ketones, ur: NEGATIVE mg/dL
LEUKOCYTES UA: NEGATIVE
NITRITE: NEGATIVE
PH: 6 (ref 5.0–8.0)
Protein, ur: 30 mg/dL — AB
SPECIFIC GRAVITY, URINE: 1.02 (ref 1.005–1.030)
UROBILINOGEN UA: 2 mg/dL — AB (ref 0.0–1.0)

## 2015-06-27 NOTE — Progress Notes (Signed)
BTL consents signed today and copy given to patient.

## 2015-06-27 NOTE — Progress Notes (Signed)
Subjective:  Debra Carr is a 35 y.o. G3P0020 at [redacted]w[redacted]d being seen today for ongoing prenatal care.  She is currently monitored for the following issues for this high-risk pregnancy and has Supervision of high risk pregnancy, antepartum; Chronic benign essential hypertension, antepartum; Advanced maternal age, primigravida, antepartum; Maternal obesity, antepartum; and Anemia affecting pregnancy, antepartum on her problem list.  Patient reports no complaints.  Contractions: Not present. Vag. Bleeding: None.  Movement: Present. Denies leaking of fluid.   The following portions of the patient's history were reviewed and updated as appropriate: allergies, current medications, past family history, past medical history, past social history, past surgical history and problem list. Problem list updated.  Objective:   Filed Vitals:   06/27/15 1047  BP: 134/76  Pulse: 84  Weight: 251 lb 3.2 oz (113.944 kg)    Fetal Status: Fetal Heart Rate (bpm): NST   Movement: Present     General:  Alert, oriented and cooperative. Patient is in no acute distress.  Skin: Skin is warm and dry. No rash noted.   Cardiovascular: Normal heart rate noted  Respiratory: Normal respiratory effort, no problems with respiration noted  Abdomen: Soft, gravid, appropriate for gestational age. Pain/Pressure: Absent     Pelvic: Vag. Bleeding: None     Cervical exam deferred        Extremities: Normal range of motion.  Edema: None  Mental Status: Normal mood and affect. Normal behavior. Normal judgment and thought content.   Urinalysis: Urine Protein: 1+ Urine Glucose: Negative NST reviewed and reactive.  Assessment and Plan:  Pregnancy: [id number] at [redacted]w[redacted]d  1. Chronic benign essential hypertension, antepartum Continue Labetalol--BP better controlled on increased dosage Continue ASA 2x/wk testing U/s for growth in 1 wk - Fetal nonstress test  2. Supervision of high risk pregnancy, antepartum, third  trimester Continue prenatal care.   Preterm labor symptoms and general obstetric precautions including but not limited to vaginal bleeding, contractions, leaking of fluid and fetal movement were reviewed in detail with the patient. Please refer to After Visit Summary for other counseling recommendations.  Return in about 7 days (around 07/04/2015) for already has bpp today and appt 5/11/7.   Donnamae Jude, MD

## 2015-06-27 NOTE — Patient Instructions (Signed)
Breastfeeding Deciding to breastfeed is one of the best choices you can make for you and your baby. A change in hormones during pregnancy causes your breast tissue to grow and increases the number and size of your milk ducts. These hormones also allow proteins, sugars, and fats from your blood supply to make breast milk in your milk-producing glands. Hormones prevent breast milk from being released before your baby is born as well as prompt milk flow after birth. Once breastfeeding has begun, thoughts of your baby, as well as his or her sucking or crying, can stimulate the release of milk from your milk-producing glands.  BENEFITS OF BREASTFEEDING For Your Baby  Your first milk (colostrum) helps your baby's digestive system function better.  There are antibodies in your milk that help your baby fight off infections.  Your baby has a lower incidence of asthma, allergies, and sudden infant death syndrome.  The nutrients in breast milk are better for your baby than infant formulas and are designed uniquely for your baby's needs.  Breast milk improves your baby's brain development.  Your baby is less likely to develop other conditions, such as childhood obesity, asthma, or type 2 diabetes mellitus. For You  Breastfeeding helps to create a very special bond between you and your baby.  Breastfeeding is convenient. Breast milk is always available at the correct temperature and costs nothing.  Breastfeeding helps to burn calories and helps you lose the weight gained during pregnancy.  Breastfeeding makes your uterus contract to its prepregnancy size faster and slows bleeding (lochia) after you give birth.   Breastfeeding helps to lower your risk of developing type 2 diabetes mellitus, osteoporosis, and breast or ovarian cancer later in life. SIGNS THAT YOUR BABY IS HUNGRY Early Signs of Hunger  Increased alertness or activity.  Stretching.  Movement of the head from side to  side.  Movement of the head and opening of the mouth when the corner of the mouth or cheek is stroked (rooting).  Increased sucking sounds, smacking lips, cooing, sighing, or squeaking.  Hand-to-mouth movements.  Increased sucking of fingers or hands. Late Signs of Hunger  Fussing.  Intermittent crying. Extreme Signs of Hunger Signs of extreme hunger will require calming and consoling before your baby will be able to breastfeed successfully. Do not wait for the following signs of extreme hunger to occur before you initiate breastfeeding:  Restlessness.  A loud, strong cry.  Screaming. BREASTFEEDING BASICS Breastfeeding Initiation  Find a comfortable place to sit or lie down, with your neck and back well supported.  Place a pillow or rolled up blanket under your baby to bring him or her to the level of your breast (if you are seated). Nursing pillows are specially designed to help support your arms and your baby while you breastfeed.  Make sure that your baby's abdomen is facing your abdomen.  Gently massage your breast. With your fingertips, massage from your chest wall toward your nipple in a circular motion. This encourages milk flow. You may need to continue this action during the feeding if your milk flows slowly.  Support your breast with 4 fingers underneath and your thumb above your nipple. Make sure your fingers are well away from your nipple and your baby's mouth.  Stroke your baby's lips gently with your finger or nipple.  When your baby's mouth is open wide enough, quickly bring your baby to your breast, placing your entire nipple and as much of the colored area around your nipple (  areola) as possible into your baby's mouth.  More areola should be visible above your baby's upper lip than below the lower lip.  Your baby's tongue should be between his or her lower gum and your breast.  Ensure that your baby's mouth is correctly positioned around your nipple  (latched). Your baby's lips should create a seal on your breast and be turned out (everted).  It is common for your baby to suck about 2-3 minutes in order to start the flow of breast milk. Latching Teaching your baby how to latch on to your breast properly is very important. An improper latch can cause nipple pain and decreased milk supply for you and poor weight gain in your baby. Also, if your baby is not latched onto your nipple properly, he or she may swallow some air during feeding. This can make your baby fussy. Burping your baby when you switch breasts during the feeding can help to get rid of the air. However, teaching your baby to latch on properly is still the best way to prevent fussiness from swallowing air while breastfeeding. Signs that your baby has successfully latched on to your nipple:  Silent tugging or silent sucking, without causing you pain.  Swallowing heard between every 3-4 sucks.  Muscle movement above and in front of his or her ears while sucking. Signs that your baby has not successfully latched on to nipple:  Sucking sounds or smacking sounds from your baby while breastfeeding.  Nipple pain. If you think your baby has not latched on correctly, slip your finger into the corner of your baby's mouth to break the suction and place it between your baby's gums. Attempt breastfeeding initiation again. Signs of Successful Breastfeeding Signs from your baby:  A gradual decrease in the number of sucks or complete cessation of sucking.  Falling asleep.  Relaxation of his or her body.  Retention of a small amount of milk in his or her mouth.  Letting go of your breast by himself or herself. Signs from you:  Breasts that have increased in firmness, weight, and size 1-3 hours after feeding.  Breasts that are softer immediately after breastfeeding.  Increased milk volume, as well as a change in milk consistency and color by the fifth day of breastfeeding.  Nipples  that are not sore, cracked, or bleeding. Signs That Your Baby is Getting Enough Milk  Wetting at least 3 diapers in a 24-hour period. The urine should be clear and pale yellow by age 5 days.  At least 3 stools in a 24-hour period by age 5 days. The stool should be soft and yellow.  At least 3 stools in a 24-hour period by age 7 days. The stool should be seedy and yellow.  No loss of weight greater than 10% of birth weight during the first 3 days of age.  Average weight gain of 4-7 ounces (113-198 g) per week after age 4 days.  Consistent daily weight gain by age 5 days, without weight loss after the age of 2 weeks. After a feeding, your baby may spit up a small amount. This is common. BREASTFEEDING FREQUENCY AND DURATION Frequent feeding will help you make more milk and can prevent sore nipples and breast engorgement. Breastfeed when you feel the need to reduce the fullness of your breasts or when your baby shows signs of hunger. This is called "breastfeeding on demand." Avoid introducing a pacifier to your baby while you are working to establish breastfeeding (the first 4-6 weeks   after your baby is born). After this time you may choose to use a pacifier. Research has shown that pacifier use during the first year of a baby's life decreases the risk of sudden infant death syndrome (SIDS). Allow your baby to feed on each breast as long as he or she wants. Breastfeed until your baby is finished feeding. When your baby unlatches or falls asleep while feeding from the first breast, offer the second breast. Because newborns are often sleepy in the first few weeks of life, you may need to awaken your baby to get him or her to feed. Breastfeeding times will vary from baby to baby. However, the following rules can serve as a guide to help you ensure that your baby is properly fed:  Newborns (babies 4 weeks of age or younger) may breastfeed every 1-3 hours.  Newborns should not go longer than 3 hours  during the day or 5 hours during the night without breastfeeding.  You should breastfeed your baby a minimum of 8 times in a 24-hour period until you begin to introduce solid foods to your baby at around 6 months of age. BREAST MILK PUMPING Pumping and storing breast milk allows you to ensure that your baby is exclusively fed your breast milk, even at times when you are unable to breastfeed. This is especially important if you are going back to work while you are still breastfeeding or when you are not able to be present during feedings. Your lactation consultant can give you guidelines on how long it is safe to store breast milk. A breast pump is a machine that allows you to pump milk from your breast into a sterile bottle. The pumped breast milk can then be stored in a refrigerator or freezer. Some breast pumps are operated by hand, while others use electricity. Ask your lactation consultant which type will work best for you. Breast pumps can be purchased, but some hospitals and breastfeeding support groups lease breast pumps on a monthly basis. A lactation consultant can teach you how to hand express breast milk, if you prefer not to use a pump. CARING FOR YOUR BREASTS WHILE YOU BREASTFEED Nipples can become dry, cracked, and sore while breastfeeding. The following recommendations can help keep your breasts moisturized and healthy:  Avoid using soap on your nipples.  Wear a supportive bra. Although not required, special nursing bras and tank tops are designed to allow access to your breasts for breastfeeding without taking off your entire bra or top. Avoid wearing underwire-style bras or extremely tight bras.  Air dry your nipples for 3-4minutes after each feeding.  Use only cotton bra pads to absorb leaked breast milk. Leaking of breast milk between feedings is normal.  Use lanolin on your nipples after breastfeeding. Lanolin helps to maintain your skin's normal moisture barrier. If you use  pure lanolin, you do not need to wash it off before feeding your baby again. Pure lanolin is not toxic to your baby. You may also hand express a few drops of breast milk and gently massage that milk into your nipples and allow the milk to air dry. In the first few weeks after giving birth, some women experience extremely full breasts (engorgement). Engorgement can make your breasts feel heavy, warm, and tender to the touch. Engorgement peaks within 3-5 days after you give birth. The following recommendations can help ease engorgement:  Completely empty your breasts while breastfeeding or pumping. You may want to start by applying warm, moist heat (in   the shower or with warm water-soaked hand towels) just before feeding or pumping. This increases circulation and helps the milk flow. If your baby does not completely empty your breasts while breastfeeding, pump any extra milk after he or she is finished.  Wear a snug bra (nursing or regular) or tank top for 1-2 days to signal your body to slightly decrease milk production.  Apply ice packs to your breasts, unless this is too uncomfortable for you.  Make sure that your baby is latched on and positioned properly while breastfeeding. If engorgement persists after 48 hours of following these recommendations, contact your health care provider or a lactation consultant. OVERALL HEALTH CARE RECOMMENDATIONS WHILE BREASTFEEDING  Eat healthy foods. Alternate between meals and snacks, eating 3 of each per day. Because what you eat affects your breast milk, some of the foods may make your baby more irritable than usual. Avoid eating these foods if you are sure that they are negatively affecting your baby.  Drink milk, fruit juice, and water to satisfy your thirst (about 10 glasses a day).  Rest often, relax, and continue to take your prenatal vitamins to prevent fatigue, stress, and anemia.  Continue breast self-awareness checks.  Avoid chewing and smoking  tobacco. Chemicals from cigarettes that pass into breast milk and exposure to secondhand smoke may harm your baby.  Avoid alcohol and drug use, including marijuana. Some medicines that may be harmful to your baby can pass through breast milk. It is important to ask your health care provider before taking any medicine, including all over-the-counter and prescription medicine as well as vitamin and herbal supplements. It is possible to become pregnant while breastfeeding. If birth control is desired, ask your health care provider about options that will be safe for your baby. SEEK MEDICAL CARE IF:  You feel like you want to stop breastfeeding or have become frustrated with breastfeeding.  You have painful breasts or nipples.  Your nipples are cracked or bleeding.  Your breasts are red, tender, or warm.  You have a swollen area on either breast.  You have a fever or chills.  You have nausea or vomiting.  You have drainage other than breast milk from your nipples.  Your breasts do not become full before feedings by the fifth day after you give birth.  You feel sad and depressed.  Your baby is too sleepy to eat well.  Your baby is having trouble sleeping.   Your baby is wetting less than 3 diapers in a 24-hour period.  Your baby has less than 3 stools in a 24-hour period.  Your baby's skin or the white part of his or her eyes becomes yellow.   Your baby is not gaining weight by 5 days of age. SEEK IMMEDIATE MEDICAL CARE IF:  Your baby is overly tired (lethargic) and does not want to wake up and feed.  Your baby develops an unexplained fever.   This information is not intended to replace advice given to you by your health care provider. Make sure you discuss any questions you have with your health care provider.   Document Released: 02/09/2005 Document Revised: 10/31/2014 Document Reviewed: 08/03/2012 Elsevier Interactive Patient Education 2016 [employer]  

## 2015-06-28 ENCOUNTER — Encounter: Payer: Self-pay | Admitting: *Deleted

## 2015-07-04 ENCOUNTER — Ambulatory Visit (INDEPENDENT_AMBULATORY_CARE_PROVIDER_SITE_OTHER): Payer: Medicaid Other | Admitting: Family Medicine

## 2015-07-04 ENCOUNTER — Encounter (HOSPITAL_COMMUNITY): Payer: Self-pay

## 2015-07-04 ENCOUNTER — Ambulatory Visit (HOSPITAL_COMMUNITY)
Admission: RE | Admit: 2015-07-04 | Discharge: 2015-07-04 | Disposition: A | Discharge status: Home / Self Care | Payer: Medicaid Other | Source: Ambulatory Visit | Attending: Obstetrics & Gynecology | Admitting: Obstetrics & Gynecology

## 2015-07-04 VITALS — BP 155/97 | HR 79 | Wt 249.0 lb

## 2015-07-04 DIAGNOSIS — O09523 Supervision of elderly multigravida, third trimester: Secondary | ICD-10-CM | POA: Insufficient documentation

## 2015-07-04 DIAGNOSIS — Z3A34 34 weeks gestation of pregnancy: Secondary | ICD-10-CM | POA: Insufficient documentation

## 2015-07-04 DIAGNOSIS — E669 Obesity, unspecified: Secondary | ICD-10-CM

## 2015-07-04 DIAGNOSIS — O0992 Supervision of high risk pregnancy, unspecified, second trimester: Secondary | ICD-10-CM

## 2015-07-04 DIAGNOSIS — O10013 Pre-existing essential hypertension complicating pregnancy, third trimester: Secondary | ICD-10-CM | POA: Insufficient documentation

## 2015-07-04 DIAGNOSIS — O10019 Pre-existing essential hypertension complicating pregnancy, unspecified trimester: Secondary | ICD-10-CM

## 2015-07-04 DIAGNOSIS — O99213 Obesity complicating pregnancy, third trimester: Secondary | ICD-10-CM | POA: Insufficient documentation

## 2015-07-04 DIAGNOSIS — O10919 Unspecified pre-existing hypertension complicating pregnancy, unspecified trimester: Secondary | ICD-10-CM

## 2015-07-04 LAB — POCT URINALYSIS DIP (DEVICE)
Glucose, UA: NEGATIVE mg/dL
Hgb urine dipstick: NEGATIVE
Ketones, ur: NEGATIVE mg/dL
NITRITE: NEGATIVE
PH: 6.5 (ref 5.0–8.0)
PROTEIN: 30 mg/dL — AB
Specific Gravity, Urine: 1.025 (ref 1.005–1.030)
UROBILINOGEN UA: 1 mg/dL (ref 0.0–1.0)

## 2015-07-04 MED ORDER — FLUCONAZOLE 150 MG PO TABS: 150.0000 mg | ORAL_TABLET | Freq: Once | ORAL | Status: DC

## 2015-07-04 MED ORDER — PRENATAL VITAMINS 0.8 MG PO TABS: 1.0000 | ORAL_TABLET | Freq: Every day | ORAL | Status: DC

## 2015-07-04 MED ORDER — PRENATAL MULTIVITAMIN CH: 1.0000 | ORAL_TABLET | Freq: Every day | ORAL | Status: DC

## 2015-07-04 NOTE — Progress Notes (Signed)
Subjective:  Debra Carr is a 35 y.o. G3P0020 at [redacted]w[redacted]d being seen today for ongoing prenatal care.  She is currently monitored for the following issues for this high-risk pregnancy and has Supervision of high risk pregnancy, antepartum; Chronic benign essential hypertension, antepartum; Advanced maternal age, primigravida, antepartum; Maternal obesity, antepartum; and Anemia affecting pregnancy, antepartum on her problem list.  Patient reports no complaints.  Contractions: Not present. Vag. Bleeding: None.  Movement: Present. Denies leaking of fluid.   The following portions of the patient's history were reviewed and updated as appropriate: allergies, current medications, past family history, past medical history, past social history, past surgical history and problem list. Problem list updated.  Objective:   Filed Vitals:   07/04/15 1008 07/04/15 1022  BP: 162/95 155/97  Pulse: 79   Weight: 249 lb (112.946 kg)     Fetal Status: Fetal Heart Rate (bpm): NST   Movement: Present     General:  Alert, oriented and cooperative. Patient is in no acute distress.  Skin: Skin is warm and dry. No rash noted.   Cardiovascular: Normal heart rate noted  Respiratory: Normal respiratory effort, no problems with respiration noted  Abdomen: Soft, gravid, appropriate for gestational age. Pain/Pressure: Present     Pelvic: Vag. Bleeding: None     Cervical exam deferred        Extremities: Normal range of motion.     Mental Status: Normal mood and affect. Normal behavior. Normal judgment and thought content.   Urinalysis:      Assessment and Plan:  Pregnancy: G3P0020 at [redacted]w[redacted]d  1. Chronic benign essential hypertension, antepartum BP elevated today.  Not taking labetalol appropriately.  Discussed that she should be taking Labetalol 400mg  TID.  NST reactive. - Fetal nonstress test  2. Supervision of high risk pregnancy, antepartum, second trimester FHT and FH normal.  Diflucan for vaginal itching  and discharge after antibiotics.  - Prenatal Vit-Fe Fumarate-FA (PRENATAL MULTIVITAMIN) TABS tablet; Take 1 tablet by mouth daily at 12 noon.  Dispense: 30 tablet; Refill: 6  Preterm labor symptoms and general obstetric precautions including but not limited to vaginal bleeding, contractions, leaking of fluid and fetal movement were reviewed in detail with the patient. Please refer to After Visit Summary for other counseling recommendations.  Return in about 5 days (around 07/09/2015) for 2x/wk as scheduled.   Truett Mainland, DO

## 2015-07-04 NOTE — Progress Notes (Signed)
[place] for growth done today,   Pt denies H/A or visual disturbances. Pt is not taking Labetalol as originally prescribed. Pt requests refill of PNV - refill sent to pharmacy.

## 2015-07-05 ENCOUNTER — Ambulatory Visit (HOSPITAL_COMMUNITY): Payer: Medicaid Other

## 2015-07-09 ENCOUNTER — Ambulatory Visit (INDEPENDENT_AMBULATORY_CARE_PROVIDER_SITE_OTHER): Payer: Medicaid Other | Admitting: *Deleted

## 2015-07-09 VITALS — BP 145/85 | HR 80

## 2015-07-09 DIAGNOSIS — O10011 Pre-existing essential hypertension complicating pregnancy, first trimester: Secondary | ICD-10-CM | POA: Diagnosis not present

## 2015-07-09 DIAGNOSIS — O10019 Pre-existing essential hypertension complicating pregnancy, unspecified trimester: Secondary | ICD-10-CM

## 2015-07-09 NOTE — Progress Notes (Signed)
NST reactive today.

## 2015-07-12 ENCOUNTER — Other Ambulatory Visit: Payer: Medicaid Other

## 2015-07-15 ENCOUNTER — Ambulatory Visit (INDEPENDENT_AMBULATORY_CARE_PROVIDER_SITE_OTHER): Payer: Medicaid Other | Admitting: *Deleted

## 2015-07-15 ENCOUNTER — Other Ambulatory Visit: Payer: Medicaid Other

## 2015-07-15 VITALS — BP 140/98 | HR 77

## 2015-07-15 DIAGNOSIS — O10011 Pre-existing essential hypertension complicating pregnancy, first trimester: Secondary | ICD-10-CM | POA: Diagnosis not present

## 2015-07-15 DIAGNOSIS — O10019 Pre-existing essential hypertension complicating pregnancy, unspecified trimester: Secondary | ICD-10-CM

## 2015-07-15 NOTE — Progress Notes (Signed)
Pt denies H/A or visual disturbances.  She confirms that she is taking labetalol as prescribed.

## 2015-07-17 NOTE — Progress Notes (Signed)
NST reactive.

## 2015-07-18 ENCOUNTER — Ambulatory Visit (INDEPENDENT_AMBULATORY_CARE_PROVIDER_SITE_OTHER): Payer: Medicaid Other | Admitting: Family Medicine

## 2015-07-18 ENCOUNTER — Other Ambulatory Visit (HOSPITAL_COMMUNITY)
Admission: RE | Admit: 2015-07-18 | Discharge: 2015-07-18 | Disposition: A | Discharge status: Home / Self Care | Payer: Medicaid Other | Source: Ambulatory Visit | Attending: Obstetrics & Gynecology | Admitting: Obstetrics & Gynecology

## 2015-07-18 VITALS — BP 149/85 | HR 83 | Wt 251.6 lb

## 2015-07-18 DIAGNOSIS — Z36 Encounter for antenatal screening of mother: Secondary | ICD-10-CM | POA: Diagnosis not present

## 2015-07-18 DIAGNOSIS — O10013 Pre-existing essential hypertension complicating pregnancy, third trimester: Secondary | ICD-10-CM

## 2015-07-18 DIAGNOSIS — O10011 Pre-existing essential hypertension complicating pregnancy, first trimester: Secondary | ICD-10-CM | POA: Diagnosis present

## 2015-07-18 DIAGNOSIS — O09513 Supervision of elderly primigravida, third trimester: Secondary | ICD-10-CM

## 2015-07-18 DIAGNOSIS — O0993 Supervision of high risk pregnancy, unspecified, third trimester: Secondary | ICD-10-CM

## 2015-07-18 DIAGNOSIS — O10019 Pre-existing essential hypertension complicating pregnancy, unspecified trimester: Secondary | ICD-10-CM

## 2015-07-18 DIAGNOSIS — Z113 Encounter for screening for infections with a predominantly sexual mode of transmission: Secondary | ICD-10-CM | POA: Diagnosis present

## 2015-07-18 LAB — POCT URINALYSIS DIP (DEVICE)
BILIRUBIN URINE: NEGATIVE
GLUCOSE, UA: NEGATIVE mg/dL
HGB URINE DIPSTICK: NEGATIVE
Ketones, ur: NEGATIVE mg/dL
LEUKOCYTES UA: NEGATIVE
NITRITE: NEGATIVE
Protein, ur: NEGATIVE mg/dL
Specific Gravity, Urine: 1.01 (ref 1.005–1.030)
UROBILINOGEN UA: 0.2 mg/dL (ref 0.0–1.0)
pH: 5.5 (ref 5.0–8.0)

## 2015-07-18 LAB — OB RESULTS CONSOLE GC/CHLAMYDIA: Gonorrhea: NEGATIVE

## 2015-07-18 LAB — OB RESULTS CONSOLE GBS: STREP GROUP B AG: NEGATIVE

## 2015-07-18 NOTE — Progress Notes (Signed)
Subjective:  Debra Carr is a 35 y.o. G3P0020 at [redacted]w[redacted]d being seen today for ongoing prenatal care.  She is currently monitored for the following issues for this high-risk pregnancy and has Supervision of high risk pregnancy, antepartum; Chronic benign essential hypertension, antepartum; Advanced maternal age, primigravida, antepartum; Maternal obesity, antepartum; and Anemia affecting pregnancy, antepartum on her problem list.  Patient reports no complaints.  Contractions: Not present. Vag. Bleeding: None.  Movement: Present. Denies leaking of fluid.   The following portions of the patient's history were reviewed and updated as appropriate: allergies, current medications, past family history, past medical history, past social history, past surgical history and problem list. Problem list updated.  Objective:   Filed Vitals:   07/18/15 0756  BP: 149/85  Pulse: 83  Weight: 251 lb 9.6 oz (114.125 kg)    Fetal Status: Fetal Heart Rate (bpm): NST   Movement: Present     General:  Alert, oriented and cooperative. Patient is in no acute distress.  Skin: Skin is warm and dry. No rash noted.   Cardiovascular: Normal heart rate noted  Respiratory: Normal respiratory effort, no problems with respiration noted  Abdomen: Soft, gravid, appropriate for gestational age. Pain/Pressure: Absent     Pelvic: Vag. Bleeding: None     Cervical exam performed Dilation: Fingertip Effacement (%): 50 Station: -3  Extremities: Normal range of motion.  Edema: None  Mental Status: Normal mood and affect. Normal behavior. Normal judgment and thought content.   Urinalysis:      Assessment and Plan:  Pregnancy: G3P0020 at [redacted]w[redacted]d  1. Supervision of high risk pregnancy, antepartum, third trimester FHT normal.  GBS, GC/CT today.  2. Chronic benign essential hypertension, antepartum BP controlled.  No symptoms.  NST reactive.  [place] on 5/11: EFW 2798g (83%) Induce at 39 weeks. - Amniotic fluid index with NST  3.  Advanced maternal age, primigravida, antepartum, third trimester   Term labor symptoms and general obstetric precautions including but not limited to vaginal bleeding, contractions, leaking of fluid and fetal movement were reviewed in detail with the patient. Please refer to After Visit Summary for other counseling recommendations.  No Follow-up on file.   Truett Mainland, DO

## 2015-07-19 LAB — GC/CHLAMYDIA PROBE AMP (~~LOC~~) NOT AT ARMC
Chlamydia: NEGATIVE
NEISSERIA GONORRHEA: NEGATIVE

## 2015-07-20 LAB — CULTURE, BETA STREP (GROUP B ONLY)

## 2015-07-23 ENCOUNTER — Other Ambulatory Visit: Payer: Medicaid Other

## 2015-07-25 ENCOUNTER — Telehealth (HOSPITAL_COMMUNITY): Payer: Self-pay | Admitting: *Deleted

## 2015-07-25 ENCOUNTER — Ambulatory Visit (INDEPENDENT_AMBULATORY_CARE_PROVIDER_SITE_OTHER): Payer: Medicaid Other | Admitting: Obstetrics and Gynecology

## 2015-07-25 ENCOUNTER — Encounter (HOSPITAL_COMMUNITY): Payer: Self-pay | Admitting: *Deleted

## 2015-07-25 VITALS — BP 138/92 | HR 87 | Wt 251.4 lb

## 2015-07-25 DIAGNOSIS — E669 Obesity, unspecified: Secondary | ICD-10-CM | POA: Diagnosis not present

## 2015-07-25 DIAGNOSIS — O99019 Anemia complicating pregnancy, unspecified trimester: Secondary | ICD-10-CM | POA: Diagnosis not present

## 2015-07-25 DIAGNOSIS — O99213 Obesity complicating pregnancy, third trimester: Secondary | ICD-10-CM

## 2015-07-25 DIAGNOSIS — O09513 Supervision of elderly primigravida, third trimester: Secondary | ICD-10-CM

## 2015-07-25 DIAGNOSIS — O10011 Pre-existing essential hypertension complicating pregnancy, first trimester: Secondary | ICD-10-CM

## 2015-07-25 DIAGNOSIS — O10019 Pre-existing essential hypertension complicating pregnancy, unspecified trimester: Secondary | ICD-10-CM

## 2015-07-25 DIAGNOSIS — Z36 Encounter for antenatal screening of mother: Secondary | ICD-10-CM | POA: Diagnosis not present

## 2015-07-25 DIAGNOSIS — O0993 Supervision of high risk pregnancy, unspecified, third trimester: Secondary | ICD-10-CM

## 2015-07-25 DIAGNOSIS — D649 Anemia, unspecified: Secondary | ICD-10-CM

## 2015-07-25 LAB — POCT URINALYSIS DIP (DEVICE)
GLUCOSE, UA: NEGATIVE mg/dL
Hgb urine dipstick: NEGATIVE
KETONES UR: NEGATIVE mg/dL
Leukocytes, UA: NEGATIVE
Nitrite: NEGATIVE
Protein, ur: 30 mg/dL — AB
Specific Gravity, Urine: 1.02 (ref 1.005–1.030)
Urobilinogen, UA: 2 mg/dL — ABNORMAL HIGH (ref 0.0–1.0)
pH: 6 (ref 5.0–8.0)

## 2015-07-25 NOTE — Telephone Encounter (Signed)
Preadmission screen  

## 2015-07-25 NOTE — Progress Notes (Signed)
Subjective:  Debra Carr is a 35 y.o. G3P0020 at [redacted]w[redacted]d being seen today for ongoing prenatal care.  She is currently monitored for the following issues for this high-risk pregnancy and has Supervision of high risk pregnancy, antepartum; Chronic benign essential hypertension, antepartum; Advanced maternal age, primigravida, antepartum; Maternal obesity, antepartum; and Anemia affecting pregnancy, antepartum on her problem list.  Patient reports no complaints.  Contractions: Not present. Vag. Bleeding: None.  Movement: Present. Denies leaking of fluid.   The following portions of the patient's history were reviewed and updated as appropriate: allergies, current medications, past family history, past medical history, past social history, past surgical history and problem list. Problem list updated.  Objective:   Filed Vitals:   07/25/15 0810  BP: 138/92  Pulse: 87  Weight: 251 lb 6.4 oz (114.034 kg)    Fetal Status: Fetal Heart Rate (bpm): NST   Movement: Present     General:  Alert, oriented and cooperative. Patient is in no acute distress.  Skin: Skin is warm and dry. No rash noted.   Cardiovascular: Normal heart rate noted  Respiratory: Normal respiratory effort, no problems with respiration noted  Abdomen: Soft, gravid, appropriate for gestational age. Pain/Pressure: Present     Pelvic: Vag. Bleeding: None     Cervical exam deferred        Extremities: Normal range of motion.  Edema: None  Mental Status: Normal mood and affect. Normal behavior. Normal judgment and thought content.   Urinalysis:      Assessment and Plan:  Pregnancy: G3P0020 at [redacted]w[redacted]d  1. Chronic benign essential hypertension, antepartum Continue labetalol 200 BID Will schedule IOL at 39 weeks Discussed induction process and answered questions Normal growth ultrasound on 5/11 - Amniotic fluid index with NST- reviewed and reactive  2. Supervision of high risk pregnancy, antepartum, third trimester Patient  is doing well without complaints  3. Advanced maternal age, primigravida, antepartum, third trimester NIPS normal  4. Maternal obesity, antepartum, third trimester   5. Anemia affecting pregnancy, antepartum   Term labor symptoms and general obstetric precautions including but not limited to vaginal bleeding, contractions, leaking of fluid and fetal movement were reviewed in detail with the patient. Please refer to After Visit Summary for other counseling recommendations.  Return in about 1 week (around 08/01/2015).   Mora Bellman, MD

## 2015-07-26 ENCOUNTER — Encounter: Payer: Self-pay | Admitting: *Deleted

## 2015-07-29 ENCOUNTER — Ambulatory Visit (INDEPENDENT_AMBULATORY_CARE_PROVIDER_SITE_OTHER): Payer: Medicaid Other | Admitting: *Deleted

## 2015-07-29 VITALS — BP 119/85 | HR 85

## 2015-07-29 DIAGNOSIS — O10011 Pre-existing essential hypertension complicating pregnancy, first trimester: Secondary | ICD-10-CM

## 2015-07-29 DIAGNOSIS — O10019 Pre-existing essential hypertension complicating pregnancy, unspecified trimester: Secondary | ICD-10-CM

## 2015-08-01 ENCOUNTER — Encounter: Payer: Self-pay | Admitting: Obstetrics and Gynecology

## 2015-08-01 ENCOUNTER — Ambulatory Visit (INDEPENDENT_AMBULATORY_CARE_PROVIDER_SITE_OTHER): Payer: Medicaid Other | Admitting: Obstetrics and Gynecology

## 2015-08-01 VITALS — BP 122/86 | HR 81 | Wt 252.0 lb

## 2015-08-01 DIAGNOSIS — O10013 Pre-existing essential hypertension complicating pregnancy, third trimester: Secondary | ICD-10-CM

## 2015-08-01 DIAGNOSIS — Z36 Encounter for antenatal screening of mother: Secondary | ICD-10-CM

## 2015-08-01 DIAGNOSIS — O99019 Anemia complicating pregnancy, unspecified trimester: Secondary | ICD-10-CM

## 2015-08-01 DIAGNOSIS — O99213 Obesity complicating pregnancy, third trimester: Secondary | ICD-10-CM

## 2015-08-01 DIAGNOSIS — O09513 Supervision of elderly primigravida, third trimester: Secondary | ICD-10-CM | POA: Diagnosis not present

## 2015-08-01 DIAGNOSIS — O0993 Supervision of high risk pregnancy, unspecified, third trimester: Secondary | ICD-10-CM

## 2015-08-01 DIAGNOSIS — O10019 Pre-existing essential hypertension complicating pregnancy, unspecified trimester: Secondary | ICD-10-CM

## 2015-08-01 DIAGNOSIS — O99013 Anemia complicating pregnancy, third trimester: Secondary | ICD-10-CM

## 2015-08-01 DIAGNOSIS — E669 Obesity, unspecified: Secondary | ICD-10-CM | POA: Diagnosis not present

## 2015-08-01 LAB — POCT URINALYSIS DIP (DEVICE)
Glucose, UA: NEGATIVE mg/dL
HGB URINE DIPSTICK: NEGATIVE
Nitrite: POSITIVE — AB
PH: 6.5 (ref 5.0–8.0)
PROTEIN: 100 mg/dL — AB
SPECIFIC GRAVITY, URINE: 1.025 (ref 1.005–1.030)
Urobilinogen, UA: >=8 mg/dL (ref 0.0–1.0)

## 2015-08-01 NOTE — Progress Notes (Signed)
IOL scheduled 6/12

## 2015-08-01 NOTE — Progress Notes (Signed)
Subjective:  Debra Carr is a 35 y.o. G3P0020 at [redacted]w[redacted]d being seen today for ongoing prenatal care.  She is currently monitored for the following issues for this high-risk pregnancy and has Supervision of high risk pregnancy, antepartum; Chronic benign essential hypertension, antepartum; Advanced maternal age, primigravida, antepartum; Maternal obesity, antepartum; and Anemia affecting pregnancy, antepartum on her problem list.  Patient reports no complaints.  Contractions: Not present. Vag. Bleeding: None.  Movement: Present. Denies leaking of fluid.   The following portions of the patient's history were reviewed and updated as appropriate: allergies, current medications, past family history, past medical history, past social history, past surgical history and problem list. Problem list updated.  Objective:   Filed Vitals:   08/01/15 0815  BP: 122/86  Pulse: 81  Weight: 252 lb (114.306 kg)    Fetal Status: Fetal Heart Rate (bpm): NST   Movement: Present  Presentation: Vertex  General:  Alert, oriented and cooperative. Patient is in no acute distress.  Skin: Skin is warm and dry. No rash noted.   Cardiovascular: Normal heart rate noted  Respiratory: Normal respiratory effort, no problems with respiration noted  Abdomen: Soft, gravid, appropriate for gestational age. Pain/Pressure: Present     Pelvic: Vag. Bleeding: None     Cervical exam deferred        Extremities: Normal range of motion.  Edema: None  Mental Status: Normal mood and affect. Normal behavior. Normal judgment and thought content.   Urinalysis:      Assessment and Plan:  Pregnancy: G3P0020 at [redacted]w[redacted]d  1. Chronic benign essential hypertension, antepartum Continue labetalol Patient scheduled for IOL on 6/13 at 39 weeks - Amniotic fluid index with NST- reviewed and reactive  2. Advanced maternal age, primigravida, antepartum, third trimester Normal panorama  3. Anemia affecting pregnancy, antepartum   4.  Maternal obesity, antepartum, third trimester   5. Supervision of high risk pregnancy, antepartum, third trimester Answered questions regarding IOL  Term labor symptoms and general obstetric precautions including but not limited to vaginal bleeding, contractions, leaking of fluid and fetal movement were reviewed in detail with the patient. Please refer to After Visit Summary for other counseling recommendations.  Return in about 6 weeks (around 09/12/2015) for PP visit.  IOL 6/12.   Mora Bellman, MD

## 2015-08-03 NOTE — Progress Notes (Signed)
NST 07/29/15 reactive

## 2015-08-05 ENCOUNTER — Inpatient Hospital Stay (HOSPITAL_COMMUNITY): Payer: Medicaid Other | Admitting: Anesthesiology

## 2015-08-05 ENCOUNTER — Encounter (HOSPITAL_COMMUNITY): Payer: Self-pay

## 2015-08-05 ENCOUNTER — Inpatient Hospital Stay (HOSPITAL_COMMUNITY)
Admission: RE | Admit: 2015-08-05 | Discharge: 2015-08-09 | DRG: 765 | Disposition: A | Discharge status: Home / Self Care | Payer: Medicaid Other | Source: Ambulatory Visit | Attending: Obstetrics and Gynecology | Admitting: Obstetrics and Gynecology

## 2015-08-05 VITALS — BP 141/69 | HR 81 | Temp 97.9°F | Resp 18 | Ht 62.0 in | Wt 250.2 lb

## 2015-08-05 DIAGNOSIS — Z302 Encounter for sterilization: Secondary | ICD-10-CM

## 2015-08-05 DIAGNOSIS — Z98891 History of uterine scar from previous surgery: Secondary | ICD-10-CM

## 2015-08-05 DIAGNOSIS — Z8249 Family history of ischemic heart disease and other diseases of the circulatory system: Secondary | ICD-10-CM

## 2015-08-05 DIAGNOSIS — O3413 Maternal care for benign tumor of corpus uteri, third trimester: Secondary | ICD-10-CM | POA: Diagnosis present

## 2015-08-05 DIAGNOSIS — O1493 Unspecified pre-eclampsia, third trimester: Secondary | ICD-10-CM | POA: Diagnosis present

## 2015-08-05 DIAGNOSIS — Z3A39 39 weeks gestation of pregnancy: Secondary | ICD-10-CM | POA: Diagnosis not present

## 2015-08-05 DIAGNOSIS — O9902 Anemia complicating childbirth: Secondary | ICD-10-CM | POA: Diagnosis present

## 2015-08-05 DIAGNOSIS — E669 Obesity, unspecified: Secondary | ICD-10-CM | POA: Diagnosis present

## 2015-08-05 DIAGNOSIS — O99213 Obesity complicating pregnancy, third trimester: Secondary | ICD-10-CM

## 2015-08-05 DIAGNOSIS — O09519 Supervision of elderly primigravida, unspecified trimester: Secondary | ICD-10-CM

## 2015-08-05 DIAGNOSIS — O10019 Pre-existing essential hypertension complicating pregnancy, unspecified trimester: Secondary | ICD-10-CM | POA: Diagnosis present

## 2015-08-05 DIAGNOSIS — O99214 Obesity complicating childbirth: Secondary | ICD-10-CM | POA: Diagnosis present

## 2015-08-05 DIAGNOSIS — O10919 Unspecified pre-existing hypertension complicating pregnancy, unspecified trimester: Secondary | ICD-10-CM | POA: Diagnosis present

## 2015-08-05 DIAGNOSIS — O0990 Supervision of high risk pregnancy, unspecified, unspecified trimester: Secondary | ICD-10-CM

## 2015-08-05 DIAGNOSIS — D259 Leiomyoma of uterus, unspecified: Secondary | ICD-10-CM | POA: Diagnosis present

## 2015-08-05 DIAGNOSIS — Z79899 Other long term (current) drug therapy: Secondary | ICD-10-CM

## 2015-08-05 DIAGNOSIS — O99210 Obesity complicating pregnancy, unspecified trimester: Secondary | ICD-10-CM | POA: Diagnosis present

## 2015-08-05 DIAGNOSIS — D649 Anemia, unspecified: Secondary | ICD-10-CM | POA: Diagnosis not present

## 2015-08-05 DIAGNOSIS — O99019 Anemia complicating pregnancy, unspecified trimester: Secondary | ICD-10-CM

## 2015-08-05 DIAGNOSIS — Z6841 Body Mass Index (BMI) 40.0 and over, adult: Secondary | ICD-10-CM | POA: Diagnosis not present

## 2015-08-05 DIAGNOSIS — Z7982 Long term (current) use of aspirin: Secondary | ICD-10-CM

## 2015-08-05 LAB — COMPREHENSIVE METABOLIC PANEL
ALBUMIN: 3 g/dL — AB (ref 3.5–5.0)
ALK PHOS: 98 U/L (ref 38–126)
ALT: 16 U/L (ref 14–54)
ALT: 16 U/L (ref 14–54)
ANION GAP: 9 (ref 5–15)
AST: 26 U/L (ref 15–41)
AST: 24 U/L (ref 15–41)
Albumin: 3 g/dL — ABNORMAL LOW (ref 3.5–5.0)
Alkaline Phosphatase: 99 U/L (ref 38–126)
Anion gap: 8 (ref 5–15)
BUN: 11 mg/dL (ref 6–20)
BUN: 7 mg/dL (ref 6–20)
CHLORIDE: 104 mmol/L (ref 101–111)
CHLORIDE: 105 mmol/L (ref 101–111)
CO2: 23 mmol/L (ref 22–32)
CO2: 21 mmol/L — AB (ref 22–32)
CREATININE: 0.56 mg/dL (ref 0.44–1.00)
CREATININE: 0.42 mg/dL — AB (ref 0.44–1.00)
Calcium: 9.1 mg/dL (ref 8.9–10.3)
Calcium: 8.3 mg/dL — ABNORMAL LOW (ref 8.9–10.3)
GFR calc non Af Amer: >60 mL/min (ref >60)
Glucose, Bld: 86 mg/dL (ref 65–99)
Glucose, Bld: 93 mg/dL (ref 65–99)
POTASSIUM: 3.8 mmol/L (ref 3.5–5.1)
POTASSIUM: 3.6 mmol/L (ref 3.5–5.1)
SODIUM: 135 mmol/L (ref 135–145)
Sodium: 135 mmol/L (ref 135–145)
Total Bilirubin: 1 mg/dL (ref 0.3–1.2)
Total Bilirubin: 0.8 mg/dL (ref 0.3–1.2)
Total Protein: 7 g/dL (ref 6.5–8.1)
Total Protein: 7.5 g/dL (ref 6.5–8.1)

## 2015-08-05 LAB — TYPE AND SCREEN
ABO/RH(D): A POS
Antibody Screen: NEGATIVE

## 2015-08-05 LAB — ABO/RH: ABO/RH(D): A POS

## 2015-08-05 LAB — PROTEIN / CREATININE RATIO, URINE
CREATININE, URINE: 173 mg/dL
Protein Creatinine Ratio: 0.21 mg/mg{Cre} — ABNORMAL HIGH (ref 0.00–0.15)
TOTAL PROTEIN, URINE: 36 mg/dL

## 2015-08-05 LAB — CBC
HEMATOCRIT: 27.8 % — AB (ref 36.0–46.0)
HEMATOCRIT: 29.2 % — AB (ref 36.0–46.0)
Hemoglobin: 9.3 g/dL — ABNORMAL LOW (ref 12.0–15.0)
Hemoglobin: 9.7 g/dL — ABNORMAL LOW (ref 12.0–15.0)
MCH: 30 pg (ref 26.0–34.0)
MCH: 30 pg (ref 26.0–34.0)
MCHC: 33.5 g/dL (ref 30.0–36.0)
MCHC: 33.2 g/dL (ref 30.0–36.0)
MCV: 89.7 fL (ref 78.0–100.0)
MCV: 90.4 fL (ref 78.0–100.0)
PLATELETS: 293 10*3/uL (ref 150–400)
Platelets: 304 10*3/uL (ref 150–400)
RBC: 3.1 MIL/uL — AB (ref 3.87–5.11)
RBC: 3.23 MIL/uL — AB (ref 3.87–5.11)
RDW: 13.7 % (ref 11.5–15.5)
RDW: 13.5 % (ref 11.5–15.5)
WBC: 12.1 10*3/uL — AB (ref 4.0–10.5)
WBC: 11.8 10*3/uL — ABNORMAL HIGH (ref 4.0–10.5)

## 2015-08-05 LAB — URINALYSIS, ROUTINE W REFLEX MICROSCOPIC
BILIRUBIN URINE: NEGATIVE
Glucose, UA: NEGATIVE mg/dL
Hgb urine dipstick: NEGATIVE
KETONES UR: NEGATIVE mg/dL
LEUKOCYTES UA: NEGATIVE
NITRITE: NEGATIVE
Protein, ur: NEGATIVE mg/dL
Specific Gravity, Urine: 1.02 (ref 1.005–1.030)
pH: 5.5 (ref 5.0–8.0)

## 2015-08-05 LAB — RPR: RPR: NONREACTIVE

## 2015-08-05 MED ORDER — TERBUTALINE SULFATE 1 MG/ML IJ SOLN: 0.2500 mg | Freq: Once | INTRAMUSCULAR | Status: DC | PRN

## 2015-08-05 MED ORDER — LABETALOL HCL 5 MG/ML IV SOLN
20.0000 mg | INTRAVENOUS | Status: DC | PRN
  Filled 2015-08-05: qty 4

## 2015-08-05 MED ORDER — DEXTROSE 5 % IV SOLN
2.0000 g | INTRAVENOUS | Status: DC
  Administered 2015-08-05: 2 g via INTRAVENOUS
  Filled 2015-08-05: qty 2

## 2015-08-05 MED ORDER — SOD CITRATE-CITRIC ACID 500-334 MG/5ML PO SOLN
30.0000 mL | ORAL | Status: DC | PRN
  Administered 2015-08-05 – 2015-08-06 (×2): 30 mL via ORAL
  Filled 2015-08-05 (×2): qty 15

## 2015-08-05 MED ORDER — LACTATED RINGERS IV SOLN
INTRAVENOUS | Status: DC
  Administered 2015-08-05 – 2015-08-06 (×3): via INTRAVENOUS

## 2015-08-05 MED ORDER — LABETALOL HCL 5 MG/ML IV SOLN
20.0000 mg | INTRAVENOUS | Status: AC | PRN
  Administered 2015-08-05: 80 mg via INTRAVENOUS
  Administered 2015-08-05: 20 mg via INTRAVENOUS
  Administered 2015-08-05: 40 mg via INTRAVENOUS
  Filled 2015-08-05: qty 16
  Filled 2015-08-05: qty 8

## 2015-08-05 MED ORDER — PHENYLEPHRINE 40 MCG/ML (10ML) SYRINGE FOR IV PUSH (FOR BLOOD PRESSURE SUPPORT): 80.0000 ug | PREFILLED_SYRINGE | INTRAVENOUS | Status: DC | PRN

## 2015-08-05 MED ORDER — LACTATED RINGERS IV SOLN
500.0000 mL | INTRAVENOUS | Status: DC | PRN
  Administered 2015-08-06: 250 mL via INTRAVENOUS

## 2015-08-05 MED ORDER — EPHEDRINE 5 MG/ML INJ: 10.0000 mg | INTRAVENOUS | Status: DC | PRN

## 2015-08-05 MED ORDER — MAGNESIUM SULFATE 50 % IJ SOLN
2.0000 g/h | INTRAVENOUS | Status: DC
  Administered 2015-08-06: 2 g/h via INTRAVENOUS
  Filled 2015-08-05 (×2): qty 80

## 2015-08-05 MED ORDER — HYDRALAZINE HCL 20 MG/ML IJ SOLN
10.0000 mg | Freq: Once | INTRAMUSCULAR | Status: AC | PRN
  Administered 2015-08-05: 10 mg via INTRAVENOUS
  Filled 2015-08-05: qty 1

## 2015-08-05 MED ORDER — LACTATED RINGERS IV SOLN
500.0000 mL | Freq: Once | INTRAVENOUS | Status: AC
  Administered 2015-08-05: 500 mL via INTRAVENOUS

## 2015-08-05 MED ORDER — ONDANSETRON HCL 4 MG/2ML IJ SOLN
4.0000 mg | Freq: Four times a day (QID) | INTRAMUSCULAR | Status: DC | PRN
  Administered 2015-08-05 – 2015-08-06 (×3): 4 mg via INTRAVENOUS
  Filled 2015-08-05 (×3): qty 2

## 2015-08-05 MED ORDER — LACTATED RINGERS IV SOLN: 500.0000 mL | Freq: Once | INTRAVENOUS | Status: DC

## 2015-08-05 MED ORDER — FENTANYL CITRATE (PF) 100 MCG/2ML IJ SOLN: 50.0000 ug | INTRAMUSCULAR | Status: DC | PRN

## 2015-08-05 MED ORDER — NIFEDIPINE ER 30 MG PO TB24
30.0000 mg | ORAL_TABLET | Freq: Two times a day (BID) | ORAL | Status: DC
  Administered 2015-08-05 – 2015-08-06 (×3): 30 mg via ORAL
  Filled 2015-08-05 (×5): qty 1

## 2015-08-05 MED ORDER — LIDOCAINE HCL (PF) 1 % IJ SOLN
INTRAMUSCULAR | Status: DC | PRN
  Administered 2015-08-05 (×2): 4 mL via EPIDURAL

## 2015-08-05 MED ORDER — OXYTOCIN 40 UNITS IN LACTATED RINGERS INFUSION - SIMPLE MED
1.0000 m[IU]/min | INTRAVENOUS | Status: DC
  Administered 2015-08-05: 2 m[IU]/min via INTRAVENOUS
  Administered 2015-08-06: 14 m[IU]/min via INTRAVENOUS
  Filled 2015-08-05: qty 1000

## 2015-08-05 MED ORDER — MAGNESIUM SULFATE BOLUS VIA INFUSION
6.0000 g | Freq: Once | INTRAVENOUS | Status: AC
  Administered 2015-08-05: 6 g via INTRAVENOUS
  Filled 2015-08-05: qty 500

## 2015-08-05 MED ORDER — FLEET ENEMA 7-19 GM/118ML RE ENEM: 1.0000 | ENEMA | RECTAL | Status: DC | PRN

## 2015-08-05 MED ORDER — ACETAMINOPHEN 325 MG PO TABS: 650.0000 mg | ORAL_TABLET | ORAL | Status: DC | PRN

## 2015-08-05 MED ORDER — HYDRALAZINE HCL 20 MG/ML IJ SOLN: 5.0000 mg | INTRAMUSCULAR | Status: DC | PRN

## 2015-08-05 MED ORDER — OXYTOCIN BOLUS FROM INFUSION: 500.0000 mL | INTRAVENOUS | Status: DC

## 2015-08-05 MED ORDER — LABETALOL HCL 200 MG PO TABS
400.0000 mg | ORAL_TABLET | Freq: Three times a day (TID) | ORAL | Status: DC
  Administered 2015-08-05 (×3): 400 mg via ORAL
  Filled 2015-08-05 (×4): qty 2

## 2015-08-05 MED ORDER — LABETALOL HCL 5 MG/ML IV SOLN
20.0000 mg | INTRAVENOUS | Status: AC | PRN
  Administered 2015-08-05: 80 mg via INTRAVENOUS
  Administered 2015-08-05: 40 mg via INTRAVENOUS
  Administered 2015-08-05: 20 mg via INTRAVENOUS
  Filled 2015-08-05 (×2): qty 4
  Filled 2015-08-05: qty 12
  Filled 2015-08-05: qty 8
  Filled 2015-08-05: qty 16

## 2015-08-05 MED ORDER — OXYTOCIN 40 UNITS IN LACTATED RINGERS INFUSION - SIMPLE MED: 2.5000 [IU]/h | INTRAVENOUS | Status: DC

## 2015-08-05 MED ORDER — PHENYLEPHRINE 40 MCG/ML (10ML) SYRINGE FOR IV PUSH (FOR BLOOD PRESSURE SUPPORT)
80.0000 ug | PREFILLED_SYRINGE | INTRAVENOUS | Status: DC | PRN
  Filled 2015-08-05 (×2): qty 10

## 2015-08-05 MED ORDER — LABETALOL HCL 200 MG PO TABS
400.0000 mg | ORAL_TABLET | Freq: Three times a day (TID) | ORAL | Status: DC
  Filled 2015-08-05: qty 2

## 2015-08-05 MED ORDER — LIDOCAINE HCL (PF) 1 % IJ SOLN: 30.0000 mL | INTRAMUSCULAR | Status: DC | PRN

## 2015-08-05 MED ORDER — MISOPROSTOL 25 MCG QUARTER TABLET
25.0000 ug | ORAL_TABLET | ORAL | Status: DC | PRN
  Administered 2015-08-05 (×3): 25 ug via VAGINAL
  Filled 2015-08-05 (×3): qty 0.25

## 2015-08-05 MED ORDER — MISOPROSTOL 25 MCG QUARTER TABLET: 25.0000 ug | ORAL_TABLET | ORAL | Status: DC

## 2015-08-05 MED ORDER — FENTANYL 2.5 MCG/ML BUPIVACAINE 1/10 % EPIDURAL INFUSION (WH - ANES)
14.0000 mL/h | INTRAMUSCULAR | Status: DC | PRN
  Administered 2015-08-05 – 2015-08-06 (×4): 14 mL/h via EPIDURAL
  Filled 2015-08-05 (×4): qty 125

## 2015-08-05 MED ORDER — DIPHENHYDRAMINE HCL 50 MG/ML IJ SOLN: 12.5000 mg | INTRAMUSCULAR | Status: DC | PRN

## 2015-08-05 NOTE — Anesthesia Procedure Notes (Signed)
Epidural Patient location during procedure: OB Start time: 08/05/2015 7:24 PM End time: 08/05/2015 7:32 PM  Staffing Anesthesiologist: Suella Broad D Performed by: anesthesiologist   Preanesthetic Checklist Completed: patient identified, site marked, surgical consent, pre-op evaluation, timeout performed, IV checked, risks and benefits discussed and monitors and equipment checked  Epidural Patient position: sitting Prep: ChloraPrep Patient monitoring: heart rate, continuous pulse ox and blood pressure Approach: midline Location: L3-L4 Injection technique: LOR saline  Needle:  Needle type: Tuohy  Needle gauge: 17 G Needle length: 9 cm Catheter type: closed end flexible Catheter size: 20 Guage Test dose: negative and 1.5% lidocaine  Assessment Events: blood not aspirated, injection not painful, no injection resistance and no paresthesia  Additional Notes LOR @ 8  Patient identified. Risks/Benefits/Options discussed with patient including but not limited to bleeding, infection, nerve damage, paralysis, failed block, incomplete pain control, headache, blood pressure changes, nausea, vomiting, reactions to medications, itching and postpartum back pain. Confirmed with bedside nurse the patient's most recent platelet count. Confirmed with patient that they are not currently taking any anticoagulation, have any bleeding history or any family history of bleeding disorders. Patient expressed understanding and wished to proceed. All questions were answered. Sterile technique was used throughout the entire procedure. Please see nursing notes for vital signs. Test dose was given through epidural catheter and negative prior to continuing to dose epidural or start infusion. Warning signs of high block given to the patient including shortness of breath, tingling/numbness in hands, complete motor block, or any concerning symptoms with instructions to call for help. Patient was given instructions on  fall risk and not to get out of bed. All questions and concerns addressed with instructions to call with any issues or inadequate analgesia.    Reason for block:procedure for pain

## 2015-08-05 NOTE — Progress Notes (Signed)
Labor Progress Note Gabriana Barros is a 35 y.o. G3P0020 at [redacted]w[redacted]d presented for IOL for CHTN S: no complaint. Denies headache, vision changes or RUQ  O:  BP 154/96 mmHg  Pulse 80  Temp(Src) 98.3 F (36.8 C) (Oral)  Resp 18  Ht 5\' 2"  (1.575 m)  Wt 251 lb (113.853 kg)  BMI 45.90 kg/m2  LMP 11/05/2014 (Exact Date) EFM: 130/mod var/no decels  CVE: Dilation: Fingertip Effacement (%): Thick Station: -2, -3 Presentation: Vertex Exam by:: a. thacker rn    A&P: 35 y.o. G3P0020 [redacted]w[redacted]d for IOL #Labor: Continue cytotec #Pain: as needed pain meds #FWB: CAT-1 #GBS: neg  Mercy Riding, MD 7:52 AM

## 2015-08-05 NOTE — Anesthesia Pain Management Evaluation Note (Signed)
  CRNA Pain Management Visit Note  Patient: Debra Carr, 35 y.o., female  "Hello I am a member of the anesthesia team at [facility]. We have an anesthesia team available at all times to provide care throughout the hospital, including epidural management and anesthesia for C-section. I don't know your plan for the delivery whether it a natural birth, water birth, IV sedation, nitrous supplementation, doula or epidural, but we want to meet your pain goals."   1.Was your pain managed to your expectations on prior hospitalizations?   Yes   2.What is your expectation for pain management during this hospitalization?     IV pain meds  3.How can we help you reach that goal? Iv pain  Record the patient's initial score and the patient's pain goal.   Pain: 0  Pain Goal: 8 [facility] wants you to be able to say your pain was always managed very well.  Assata Juncaj 08/05/2015

## 2015-08-05 NOTE — Progress Notes (Addendum)
LABOR PROGRESS NOTE  Debra Carr is a 35 y.o. G3P0020 at [redacted]w[redacted]d  admitted for IOL for cHTN  Subjective: Pt is comfortable with epidural in place.  Denies any HA, changes in vision, RUQ pain, BLE swelling, CP, SOB, n/v  Objective: BP 159/89 mmHg  Pulse 76  Temp(Src) 97.9 F (36.6 C) (Oral)  Resp 17  Ht 5\' 2"  (1.575 m)  Wt 251 lb (113.853 kg)  BMI 45.90 kg/m2  LMP 11/05/2014 (Exact Date) or  Filed Vitals:   08/05/15 1430 08/05/15 1438 08/05/15 1451 08/05/15 1501  BP: 164/94 169/96 158/99 159/89  Pulse: 76 77 78 76  Temp:      TempSrc:      Resp: 17 18 17 17   Height:      Weight:        Dilation: 1.5 Effacement (%): Thick Cervical Position: Middle Station: Ballotable Presentation: Vertex Exam by:: Wouk, MD  Labs: Lab Results  Component Value Date   WBC 12.1* 08/05/2015   HGB 9.3* 08/05/2015   HCT 27.8* 08/05/2015   MCV 89.7 08/05/2015   PLT 304 08/05/2015    Patient Active Problem List   Diagnosis Date Noted  . Chronic hypertension in pregnancy 08/05/2015  . Anemia affecting pregnancy, antepartum 03/12/2015  . Supervision of high risk pregnancy, antepartum 03/07/2015  . Chronic benign essential hypertension, antepartum 03/07/2015  . Advanced maternal age, primigravida, antepartum 03/07/2015  . Maternal obesity, antepartum 03/07/2015    Assessment / Plan: 35 y.o. G3P0020 at [redacted]w[redacted]d here for IOL for cHTN  #cHTN- remains elevated -Cont labetolol and hydralazine IV prn -Cont ppx Mag, started at 1230 today -Cont labetolol 400 TID -Cont procardia 30 BID  Labor: Latent, FB placed earlier remains in place, cont cytotec Fetal Wellbeing:  Cat I tracing, ctx, q4-5 min Pain Control:   Anticipated MOD:  NSVD  Melany Guernsey, MD 08/05/2015, 3:02 PM

## 2015-08-05 NOTE — Progress Notes (Signed)
Labor Progress Note Debra Carr is a 35 y.o. G3P0020 at [redacted]w[redacted]d presented for IOL for cHTN S: No complaints. Notes feeling tired, but denies pain with contractions. Denies HA, vision changes, N/V.  O:  BP 166/94 mmHg  Pulse 77  Temp(Src) 98.2 F (36.8 C) (Oral)  Resp 18  Ht 5\' 2"  (1.575 m)  Wt 113.853 kg (251 lb)  BMI 45.90 kg/m2  LMP 11/05/2014 (Exact Date) General: alert, cooperative and no distress Chest: normal WOB DVT Evaluation: No evidence of DVT seen on physical exam. EFM: 135/mod/+accels/no decels  CVE: Dilation: 1.5 Effacement (%): Thick Cervical Position: Middle Station: Ballotable Presentation: Vertex Exam by:: Wouk, MD   A&P: 35 y.o. [id number] [redacted]w[redacted]d for IOL. #Labor: Minimal change. Continuing cytotec. Foley bulb placed. #Pain: No current pain.  #FWB: Category 1 strip #GBS: neg  Damita Lack, Med Student 11:00 AM

## 2015-08-05 NOTE — Anesthesia Preprocedure Evaluation (Signed)
Anesthesia Evaluation  Patient identified by MRN, date of birth, ID band Patient awake    Reviewed: Allergy & Precautions, NPO status , Patient's Chart, lab work & pertinent test results  Airway Mallampati: III  TM Distance: >3 FB Neck ROM: Full    Dental  (+) Teeth Intact   Pulmonary neg pulmonary ROS,    breath sounds clear to auscultation       Cardiovascular hypertension, Pt. on medications  Rhythm:Regular Rate:Normal     Neuro/Psych negative neurological ROS  negative psych ROS   GI/Hepatic negative GI ROS, Neg liver ROS,   Endo/Other  negative endocrine ROS  Renal/GU negative Renal ROS  negative genitourinary   Musculoskeletal negative musculoskeletal ROS (+)   Abdominal   Peds negative pediatric ROS (+)  Hematology negative hematology ROS (+)   Anesthesia Other Findings   Reproductive/Obstetrics (+) Pregnancy                             Lab Results  Component Value Date   WBC 11.8* 08/05/2015   HGB 9.7* 08/05/2015   HCT 29.2* 08/05/2015   MCV 90.4 08/05/2015   PLT 293 08/05/2015   No results found for: INR, PROTIME   Anesthesia Physical Anesthesia Plan  ASA: III  Anesthesia Plan: Epidural   Post-op Pain Management:    Induction:   Airway Management Planned:   Additional Equipment:   Intra-op Plan:   Post-operative Plan:   Informed Consent: I have reviewed the patients History and Physical, chart, labs and discussed the procedure including the risks, benefits and alternatives for the proposed anesthesia with the patient or authorized representative who has indicated his/her understanding and acceptance.     Plan Discussed with:   Anesthesia Plan Comments:         Anesthesia Quick Evaluation

## 2015-08-05 NOTE — H&P (Signed)
LABOR ADMISSION HISTORY AND PHYSICAL  Debra Carr is a 35 y.o. female G3P0020 with IUP at [redacted]w[redacted]d by 7w [place] presenting for scheduled IOL for cHTN. She reports no contraction. Fetal movement present and at baseline. Denies fluid leak or gush, vaginal bleeding,  headaches, blurry vision, RUQ pain or peripheral edema. She plans on breast and bottle feeding. She request Lvg-IUD for birth control.   Dating: By 7 wk US--->  Estimated Date of Delivery: 08/12/15  Sono:    @[redacted]w[redacted]d , CWD, normal anatomy, cephalic presentation, longitudinal lie, 2798 g, 83% EFW   Prenatal History/Complications: Clinic  [facility] Prenatal Labs  Dating  7 wk Blood type: A/POS/-- (01/12 [id number]) A POS  Genetic Screen  NIPS: nml Antibody:NEG (01/12 0927) NEG  Anatomic [place]  normal Rubella: 1.11 (01/12 0927)IMMUNE  GTT Early:  66           Third trimester: 98 RPR: Non Reactive (04/20 1506) NR  Flu vaccine  declined HBsAg: NEGATIVE (01/12 0927) NEG  TDaP vaccine  05/13/15                                      HIV: NONREACTIVE (03/20 0919) NR  Baby Food   Bottle                                            [id number] (05/25 0000)neg  Contraception BTL papers Pap:  NEEDS  Circumcision female   Pediatrician Not picked yet   Support Person  Sister Research scientist (life sciences))    Past Medical History: Past Medical History  Diagnosis Date  . Hypertension   . Ectopic pregnancy   . Thyroid disorder     Past Surgical History: Past Surgical History  Procedure Laterality Date  . Laparoscopy      Obstetrical History: OB History    Gravida Para Term Preterm AB TAB SAB Ectopic Multiple Living   3    2  1 1   0      Social History: Social History   Social History  . Marital Status: Single    Spouse Name: N/A  . Number of Children: N/A  . Years of Education: N/A   Social History Main Topics  . Smoking status: Never Smoker   . Smokeless tobacco: Never Used  . Alcohol Use: No  . Drug Use: No  . Sexual Activity: Yes    Birth Control/  Protection: None   Other Topics Concern  . None   Social History Narrative    Family History: Family History  Problem Relation Age of Onset  . Hypertension Mother     Allergies: No Known Allergies  Prescriptions prior to admission  Medication Sig Dispense Refill Last Dose  . aspirin 81 MG tablet Take 1 tablet (81 mg total) by mouth daily. 30 tablet 6 Taking  . ferrous sulfate 325 (65 FE) MG tablet Take 1 tablet (325 mg total) by mouth daily with breakfast. 30 tablet 3 Taking  . labetalol (NORMODYNE) 200 MG tablet Take 2 tablets (400 mg total) by mouth 3 (three) times daily. 180 tablet 3 Taking  . Prenatal Multivit-Min-Fe-FA (PRENATAL VITAMINS) 0.8 MG tablet Take 1 tablet by mouth daily. 30 tablet 6 Taking     Review of Systems  Blood pressure 154/96, pulse 80, temperature 98.3 F (36.8 C), temperature  source Oral, resp. rate 18, height 5\' 2"  (1.575 m), weight 251 lb (113.853 kg), last menstrual period 11/05/2014. GEN: appearance: alert, cooperative and appears stated age RESP: clear to auscultation bilaterally, no increased WOB CVS:: regular rate and rhythm, no murmurs, no sign of DVT, +2 DP GI: soft, non-tender; bowel sounds normal MSK: WWP, Homans sign is negative,   Pelvic Exam: Cervical exam: Dilation: Fingertip Effacement (%): Thick Station: -2, -3 Exam by:: a. thacker rn Presentation: cephalic Uterine activityNone  Fetal monitoringBaseline: 130 bpm, Variability: Good {> 6 bpm), Accelerations: Reactive and Decelerations: Absent  Prenatal labs: ABO, Rh: A/POS/-- (01/12 0927) Antibody: NEG (01/12 0927) Rubella: immune RPR: Non Reactive (04/20 1506)  HBsAg: NEGATIVE (01/12 0927)  HIV: NONREACTIVE (03/20 0919)  GBS: Negative (05/25 0000)  1 hr Glucola 98 Genetic screening  NIPS normal Anatomy US normal  Prenatal Transfer Tool  Maternal Diabetes: No Genetic Screening: Normal Maternal Ultrasounds/Referrals: Normal Fetal Ultrasounds or other Referrals:   None Maternal Substance Abuse:  No Significant Maternal Medications:  Meds include: Other: Labetoalol Significant Maternal Lab Results: None  Results for orders placed or performed during the hospital encounter of 08/05/15 (from the past 24 hour(s))  CBC   Collection Time: 08/05/15  1:05 AM  Result Value Ref Range   WBC 12.1 (H) 4.0 - 10.5 K/uL   RBC 3.10 (L) 3.87 - 5.11 MIL/uL   Hemoglobin 9.3 (L) 12.0 - 15.0 g/dL   HCT 27.8 (L) 36.0 - 46.0 %   MCV 89.7 78.0 - 100.0 fL   MCH 30.0 26.0 - 34.0 pg   MCHC 33.5 30.0 - 36.0 g/dL   RDW 13.7 11.5 - 15.5 %   Platelets 304 150 - 400 K/uL    Patient Active Problem List   Diagnosis Date Noted  . Chronic hypertension in pregnancy 08/05/2015  . Anemia affecting pregnancy, antepartum 03/12/2015  . Supervision of high risk pregnancy, antepartum 03/07/2015  . Chronic benign essential hypertension, antepartum 03/07/2015  . Advanced maternal age, primigravida, antepartum 03/07/2015  . Maternal obesity, antepartum 03/07/2015    Assessment: Debra Carr is a 35 y.o. G3P0020 at [redacted]w[redacted]d here for IOL for cHTN. BP in mild range. No severe features. Urine protein. UA with nitrite and LE 4 days ago. No culture sent.   #cHTN: continue home labetalol. PIH labs #Labor: Cytotec for induction #Pain: IV meds as needed. #FWB:  CAT-1 #ID: GBS negative. UA and urine culture. CTX after urine collection. #MOF: Breast and Bottle #MOC:Mirena #Circ:  n/a  Mercy Riding 08/05/2015, 1:27 AM

## 2015-08-05 NOTE — Progress Notes (Signed)
LABOR PROGRESS NOTE  Debra Carr is a 35 y.o. G3P0020 at [redacted]w[redacted]d  admitted for IOl for cHTN  Subjective: Pt is comfortable, reports only minimal discomfort.  Pitocin to be started.   Objective: BP 126/78 mmHg  Pulse 85  Temp(Src) 98 F (36.7 C) (Oral)  Resp 18  Ht 5\' 2"  (1.575 m)  Wt 251 lb (113.853 kg)  BMI 45.90 kg/m2  LMP 11/05/2014 (Exact Date) or  Filed Vitals:   08/05/15 1621 08/05/15 1630 08/05/15 1641 08/05/15 1720  BP: 142/90 144/88 138/92 126/78  Pulse: 88 89 91 85  Temp:    98 F (36.7 C)  TempSrc:    Oral  Resp: 18 17 17 18   Height:      Weight:        Dilation: 4 Effacement (%): 30 Cervical Position: Middle Station: Ballotable Presentation: Vertex Exam by:: Adrian Prince, RN  Labs: Lab Results  Component Value Date   WBC 12.1* 08/05/2015   HGB 9.3* 08/05/2015   HCT 27.8* 08/05/2015   MCV 89.7 08/05/2015   PLT 304 08/05/2015    Patient Active Problem List   Diagnosis Date Noted  . Chronic hypertension in pregnancy 08/05/2015  . Anemia affecting pregnancy, antepartum 03/12/2015  . Supervision of high risk pregnancy, antepartum 03/07/2015  . Chronic benign essential hypertension, antepartum 03/07/2015  . Advanced maternal age, primigravida, antepartum 03/07/2015  . Maternal obesity, antepartum 03/07/2015    Assessment / Plan: 35 y.o. G3P0020 at [redacted]w[redacted]d here for IOL for cHTN  Labor: Latent, FB done switched from cytotec to pitocin now Fetal Wellbeing:  Cat I Pain Control:  Fentanyl Anticipated MOD:  NSVD  Melany Guernsey, MD 08/05/2015, 5:29 PM

## 2015-08-05 NOTE — Progress Notes (Signed)
LABOR PROGRESS NOTE  Debra Carr is a 35 y.o. G3P0020 at [redacted]w[redacted]d  admitted for iol for chtn  Subjective: No ha, vision change, epigastric pain, or sob  Objective: BP 153/92 mmHg  Pulse 78  Temp(Src) 98.2 F (36.8 C) (Oral)  Resp 18  Ht 5\' 2"  (1.575 m)  Wt 251 lb (113.853 kg)  BMI 45.90 kg/m2  LMP 11/05/2014 (Exact Date) or  Filed Vitals:   08/05/15 1112 08/05/15 1140 08/05/15 1142 08/05/15 1152  BP: 158/84 153/88 149/80 153/92  Pulse: 79 78 79 78  Temp:      TempSrc:      Resp: 18 18 18 18   Height:      Weight:        140/mod/+a/-3  Dilation: 1.5 Effacement (%): Thick Cervical Position: Middle Station: Ballotable Presentation: Vertex Exam by:: Abhiram Criado, MD  Labs: Lab Results  Component Value Date   WBC 12.1* 08/05/2015   HGB 9.3* 08/05/2015   HCT 27.8* 08/05/2015   MCV 89.7 08/05/2015   PLT 304 08/05/2015    Patient Active Problem List   Diagnosis Date Noted  . Chronic hypertension in pregnancy 08/05/2015  . Anemia affecting pregnancy, antepartum 03/12/2015  . Supervision of high risk pregnancy, antepartum 03/07/2015  . Chronic benign essential hypertension, antepartum 03/07/2015  . Advanced maternal age, primigravida, antepartum 03/07/2015  . Maternal obesity, antepartum 03/07/2015    Assessment / Plan: 35 y.o. G3P0020 at [redacted]w[redacted]d here for iol for chtn  Labor: foley placed, cytotec q4 hrs continuing Fetal Wellbeing:  Cat 1 Pain Control:  Non-pharm for now Anticipated MOD:  Vag CHTN: here persistent severe-range BPs despite multiple doses IV prns and continuation of home oral meds. Will now start Mg and add procardia 30 bid  Desma Maxim, MD 08/05/2015, 12:22 PM

## 2015-08-06 ENCOUNTER — Encounter (HOSPITAL_COMMUNITY)
Admission: RE | Disposition: A | Discharge status: Home / Self Care | Payer: Self-pay | Source: Ambulatory Visit | Attending: Obstetrics and Gynecology

## 2015-08-06 ENCOUNTER — Encounter (HOSPITAL_COMMUNITY): Payer: Self-pay

## 2015-08-06 DIAGNOSIS — Z302 Encounter for sterilization: Secondary | ICD-10-CM

## 2015-08-06 DIAGNOSIS — O9902 Anemia complicating childbirth: Secondary | ICD-10-CM

## 2015-08-06 DIAGNOSIS — Z3A39 39 weeks gestation of pregnancy: Secondary | ICD-10-CM

## 2015-08-06 DIAGNOSIS — O3413 Maternal care for benign tumor of corpus uteri, third trimester: Secondary | ICD-10-CM

## 2015-08-06 DIAGNOSIS — Z98891 History of uterine scar from previous surgery: Secondary | ICD-10-CM

## 2015-08-06 DIAGNOSIS — D649 Anemia, unspecified: Secondary | ICD-10-CM

## 2015-08-06 DIAGNOSIS — O09513 Supervision of elderly primigravida, third trimester: Secondary | ICD-10-CM

## 2015-08-06 DIAGNOSIS — O1092 Unspecified pre-existing hypertension complicating childbirth: Secondary | ICD-10-CM

## 2015-08-06 LAB — CBC
HEMATOCRIT: 28 % — AB (ref 36.0–46.0)
Hemoglobin: 9.2 g/dL — ABNORMAL LOW (ref 12.0–15.0)
MCH: 30 pg (ref 26.0–34.0)
MCHC: 32.9 g/dL (ref 30.0–36.0)
MCV: 91.2 fL (ref 78.0–100.0)
Platelets: 270 10*3/uL (ref 150–400)
RBC: 3.07 MIL/uL — AB (ref 3.87–5.11)
RDW: 14.1 % (ref 11.5–15.5)
WBC: 27.3 10*3/uL — AB (ref 4.0–10.5)

## 2015-08-06 LAB — URINE CULTURE

## 2015-08-06 LAB — MAGNESIUM: MAGNESIUM: 7.3 mg/dL — AB (ref 1.7–2.4)

## 2015-08-06 SURGERY — Surgical Case
Anesthesia: Epidural

## 2015-08-06 MED ORDER — ACETAMINOPHEN 325 MG PO TABS: 650.0000 mg | ORAL_TABLET | ORAL | Status: DC | PRN

## 2015-08-06 MED ORDER — NALBUPHINE HCL 10 MG/ML IJ SOLN: 5.0000 mg | Freq: Once | INTRAMUSCULAR | Status: DC | PRN

## 2015-08-06 MED ORDER — LABETALOL HCL 5 MG/ML IV SOLN
20.0000 mg | Freq: Once | INTRAVENOUS | Status: AC
  Administered 2015-08-06: 20 mg via INTRAVENOUS

## 2015-08-06 MED ORDER — LACTATED RINGERS IV SOLN
INTRAVENOUS | Status: DC
  Administered 2015-08-07 – 2015-08-08 (×3): via INTRAVENOUS

## 2015-08-06 MED ORDER — SODIUM CHLORIDE 0.9% FLUSH: 3.0000 mL | INTRAVENOUS | Status: DC | PRN

## 2015-08-06 MED ORDER — DEXAMETHASONE SODIUM PHOSPHATE 4 MG/ML IJ SOLN
INTRAMUSCULAR | Status: AC
  Filled 2015-08-06: qty 1

## 2015-08-06 MED ORDER — MORPHINE SULFATE (PF) 0.5 MG/ML IJ SOLN
INTRAMUSCULAR | Status: AC
  Filled 2015-08-06: qty 10

## 2015-08-06 MED ORDER — GLYCOPYRROLATE 0.2 MG/ML IJ SOLN
INTRAMUSCULAR | Status: DC | PRN
Start: 2015-08-06 — End: 2015-08-06
  Administered 2015-08-06: 0.1 mg via INTRAVENOUS

## 2015-08-06 MED ORDER — DEXAMETHASONE SODIUM PHOSPHATE 4 MG/ML IJ SOLN
INTRAMUSCULAR | Status: DC | PRN
  Administered 2015-08-06: 4 mg via INTRAVENOUS

## 2015-08-06 MED ORDER — NALOXONE HCL 0.4 MG/ML IJ SOLN: 0.4000 mg | INTRAMUSCULAR | Status: DC | PRN

## 2015-08-06 MED ORDER — MAGNESIUM SULFATE 50 % IJ SOLN
2.0000 g/h | INTRAVENOUS | Status: AC
  Administered 2015-08-07 (×2): 2 g/h via INTRAVENOUS
  Filled 2015-08-06 (×2): qty 80

## 2015-08-06 MED ORDER — SCOPOLAMINE 1 MG/3DAYS TD PT72: 1.0000 | MEDICATED_PATCH | Freq: Once | TRANSDERMAL | Status: DC

## 2015-08-06 MED ORDER — OXYTOCIN 10 UNIT/ML IJ SOLN
40.0000 [IU] | INTRAVENOUS | Status: DC | PRN
  Administered 2015-08-06: 40 [IU] via INTRAVENOUS

## 2015-08-06 MED ORDER — NALOXONE HCL 2 MG/2ML IJ SOSY
1.0000 ug/kg/h | PREFILLED_SYRINGE | INTRAVENOUS | Status: DC | PRN
  Filled 2015-08-06: qty 2

## 2015-08-06 MED ORDER — NIFEDIPINE ER 30 MG PO TB24
30.0000 mg | ORAL_TABLET | Freq: Two times a day (BID) | ORAL | Status: DC
  Administered 2015-08-07 (×2): 30 mg via ORAL
  Filled 2015-08-06 (×2): qty 1

## 2015-08-06 MED ORDER — MORPHINE SULFATE (PF) 0.5 MG/ML IJ SOLN
INTRAMUSCULAR | Status: DC | PRN
  Administered 2015-08-06: 1 mg via INTRAVENOUS
  Administered 2015-08-06: 4 mg via EPIDURAL

## 2015-08-06 MED ORDER — ONDANSETRON HCL 4 MG/2ML IJ SOLN
INTRAMUSCULAR | Status: AC
  Filled 2015-08-06: qty 2

## 2015-08-06 MED ORDER — SODIUM BICARBONATE 8.4 % IV SOLN
INTRAVENOUS | Status: DC | PRN
  Administered 2015-08-06: 3 mL via EPIDURAL
  Administered 2015-08-06 (×3): 5 mL via EPIDURAL

## 2015-08-06 MED ORDER — LABETALOL HCL 5 MG/ML IV SOLN
INTRAVENOUS | Status: AC
  Administered 2015-08-06: 20 mg via INTRAVENOUS
  Filled 2015-08-06: qty 4

## 2015-08-06 MED ORDER — PRENATAL MULTIVITAMIN CH
1.0000 | ORAL_TABLET | Freq: Every day | ORAL | Status: DC
  Administered 2015-08-07 – 2015-08-09 (×3): 1 via ORAL
  Filled 2015-08-06 (×3): qty 1

## 2015-08-06 MED ORDER — PHENYLEPHRINE 40 MCG/ML (10ML) SYRINGE FOR IV PUSH (FOR BLOOD PRESSURE SUPPORT)
PREFILLED_SYRINGE | INTRAVENOUS | Status: AC
  Filled 2015-08-06: qty 30

## 2015-08-06 MED ORDER — MEPERIDINE HCL 25 MG/ML IJ SOLN: 6.2500 mg | INTRAMUSCULAR | Status: DC | PRN

## 2015-08-06 MED ORDER — EPHEDRINE 5 MG/ML INJ
INTRAVENOUS | Status: AC
  Filled 2015-08-06: qty 10

## 2015-08-06 MED ORDER — DIPHENHYDRAMINE HCL 50 MG/ML IJ SOLN: 12.5000 mg | INTRAMUSCULAR | Status: DC | PRN

## 2015-08-06 MED ORDER — DIBUCAINE 1 % RE OINT
1.0000 "application " | TOPICAL_OINTMENT | RECTAL | Status: DC | PRN
  Filled 2015-08-06: qty 28.4

## 2015-08-06 MED ORDER — MENTHOL 3 MG MT LOZG
1.0000 | LOZENGE | OROMUCOSAL | Status: DC | PRN
  Filled 2015-08-06: qty 9

## 2015-08-06 MED ORDER — OXYTOCIN 10 UNIT/ML IJ SOLN
INTRAMUSCULAR | Status: AC
  Filled 2015-08-06: qty 4

## 2015-08-06 MED ORDER — ACETAMINOPHEN 500 MG PO TABS
1000.0000 mg | ORAL_TABLET | Freq: Four times a day (QID) | ORAL | Status: AC
  Administered 2015-08-07 (×2): 1000 mg via ORAL
  Filled 2015-08-06 (×2): qty 2

## 2015-08-06 MED ORDER — COCONUT OIL OIL
1.0000 "application " | TOPICAL_OIL | Status: DC | PRN
  Filled 2015-08-06: qty 120

## 2015-08-06 MED ORDER — LACTATED RINGERS IV SOLN
INTRAVENOUS | Status: DC | PRN
  Administered 2015-08-06 (×2): via INTRAVENOUS

## 2015-08-06 MED ORDER — OXYTOCIN 40 UNITS IN LACTATED RINGERS INFUSION - SIMPLE MED: 2.5000 [IU]/h | INTRAVENOUS | Status: AC

## 2015-08-06 MED ORDER — ONDANSETRON HCL 4 MG/2ML IJ SOLN: 4.0000 mg | Freq: Three times a day (TID) | INTRAMUSCULAR | Status: DC | PRN

## 2015-08-06 MED ORDER — NALBUPHINE HCL 10 MG/ML IJ SOLN: 5.0000 mg | INTRAMUSCULAR | Status: DC | PRN

## 2015-08-06 MED ORDER — FENTANYL CITRATE (PF) 100 MCG/2ML IJ SOLN: 25.0000 ug | INTRAMUSCULAR | Status: DC | PRN

## 2015-08-06 MED ORDER — EPHEDRINE SULFATE 50 MG/ML IJ SOLN
INTRAMUSCULAR | Status: DC | PRN
  Administered 2015-08-06 (×2): 5 mg via INTRAVENOUS
  Administered 2015-08-06: 10 mg via INTRAVENOUS

## 2015-08-06 MED ORDER — ONDANSETRON HCL 4 MG/2ML IJ SOLN
INTRAMUSCULAR | Status: DC | PRN
  Administered 2015-08-06: 4 mg via INTRAVENOUS

## 2015-08-06 MED ORDER — MEPERIDINE HCL 25 MG/ML IJ SOLN
INTRAMUSCULAR | Status: AC
  Filled 2015-08-06: qty 1

## 2015-08-06 MED ORDER — IBUPROFEN 600 MG PO TABS
600.0000 mg | ORAL_TABLET | Freq: Four times a day (QID) | ORAL | Status: DC
  Administered 2015-08-07 – 2015-08-09 (×9): 600 mg via ORAL
  Filled 2015-08-06 (×10): qty 1

## 2015-08-06 MED ORDER — GLYCOPYRROLATE 0.2 MG/ML IJ SOLN
INTRAMUSCULAR | Status: AC
  Filled 2015-08-06: qty 1

## 2015-08-06 MED ORDER — SCOPOLAMINE 1 MG/3DAYS TD PT72
MEDICATED_PATCH | TRANSDERMAL | Status: AC
  Filled 2015-08-06: qty 1

## 2015-08-06 MED ORDER — OXYCODONE-ACETAMINOPHEN 5-325 MG PO TABS
1.0000 | ORAL_TABLET | ORAL | Status: DC | PRN
Start: 2015-08-06 — End: 2015-08-09

## 2015-08-06 MED ORDER — SENNOSIDES-DOCUSATE SODIUM 8.6-50 MG PO TABS
2.0000 | ORAL_TABLET | ORAL | Status: DC
  Administered 2015-08-08 (×2): 2 via ORAL
  Filled 2015-08-06 (×2): qty 2

## 2015-08-06 MED ORDER — PHENYLEPHRINE HCL 10 MG/ML IJ SOLN
10000.0000 ug | INTRAMUSCULAR | Status: DC | PRN
  Administered 2015-08-06: 40 ug via INTRAVENOUS
  Administered 2015-08-06 (×3): 80 ug via INTRAVENOUS

## 2015-08-06 MED ORDER — MEPERIDINE HCL 25 MG/ML IJ SOLN
INTRAMUSCULAR | Status: DC | PRN
  Administered 2015-08-06 (×2): 12.5 mg via INTRAVENOUS

## 2015-08-06 MED ORDER — SIMETHICONE 80 MG PO CHEW
80.0000 mg | CHEWABLE_TABLET | ORAL | Status: DC
  Administered 2015-08-08 (×2): 80 mg via ORAL
  Filled 2015-08-06: qty 1

## 2015-08-06 MED ORDER — PROMETHAZINE HCL 25 MG/ML IJ SOLN
12.5000 mg | Freq: Three times a day (TID) | INTRAMUSCULAR | Status: DC | PRN
  Administered 2015-08-06: 12.5 mg via INTRAVENOUS
  Filled 2015-08-06: qty 1

## 2015-08-06 MED ORDER — DIPHENHYDRAMINE HCL 25 MG PO CAPS
25.0000 mg | ORAL_CAPSULE | Freq: Four times a day (QID) | ORAL | Status: DC | PRN
  Administered 2015-08-07: 25 mg via ORAL
  Filled 2015-08-06: qty 1

## 2015-08-06 MED ORDER — LABETALOL HCL 200 MG PO TABS
200.0000 mg | ORAL_TABLET | Freq: Three times a day (TID) | ORAL | Status: DC
  Administered 2015-08-06 (×2): 200 mg via ORAL
  Filled 2015-08-06 (×4): qty 1

## 2015-08-06 MED ORDER — OXYCODONE-ACETAMINOPHEN 5-325 MG PO TABS: 2.0000 | ORAL_TABLET | ORAL | Status: DC | PRN

## 2015-08-06 MED ORDER — PROMETHAZINE HCL 25 MG/ML IJ SOLN: 12.5000 mg | Freq: Four times a day (QID) | INTRAMUSCULAR | Status: DC | PRN

## 2015-08-06 MED ORDER — SIMETHICONE 80 MG PO CHEW: 80.0000 mg | CHEWABLE_TABLET | ORAL | Status: DC | PRN

## 2015-08-06 MED ORDER — CEFAZOLIN SODIUM-DEXTROSE 2-4 GM/100ML-% IV SOLN
2.0000 g | INTRAVENOUS | Status: AC
  Administered 2015-08-06: 2 g via INTRAVENOUS

## 2015-08-06 MED ORDER — SIMETHICONE 80 MG PO CHEW
80.0000 mg | CHEWABLE_TABLET | Freq: Three times a day (TID) | ORAL | Status: DC
  Administered 2015-08-07 – 2015-08-09 (×7): 80 mg via ORAL
  Filled 2015-08-06 (×7): qty 1

## 2015-08-06 MED ORDER — TETANUS-DIPHTH-ACELL PERTUSSIS 5-2.5-18.5 LF-MCG/0.5 IM SUSP
0.5000 mL | Freq: Once | INTRAMUSCULAR | Status: DC
  Filled 2015-08-06: qty 0.5

## 2015-08-06 MED ORDER — WITCH HAZEL-GLYCERIN EX PADS: 1.0000 "application " | MEDICATED_PAD | CUTANEOUS | Status: DC | PRN

## 2015-08-06 MED ORDER — CEFAZOLIN SODIUM-DEXTROSE 2-4 GM/100ML-% IV SOLN
INTRAVENOUS | Status: AC
  Filled 2015-08-06: qty 100

## 2015-08-06 MED ORDER — ZOLPIDEM TARTRATE 5 MG PO TABS: 5.0000 mg | ORAL_TABLET | Freq: Every evening | ORAL | Status: DC | PRN

## 2015-08-06 MED ORDER — SCOPOLAMINE 1 MG/3DAYS TD PT72
MEDICATED_PATCH | TRANSDERMAL | Status: DC | PRN
  Administered 2015-08-06: 1 via TRANSDERMAL

## 2015-08-06 MED ORDER — BUPIVACAINE HCL (PF) 0.5 % IJ SOLN
INTRAMUSCULAR | Status: AC
  Filled 2015-08-06: qty 30

## 2015-08-06 MED ORDER — DIPHENHYDRAMINE HCL 25 MG PO CAPS: 25.0000 mg | ORAL_CAPSULE | ORAL | Status: DC | PRN

## 2015-08-06 SURGICAL SUPPLY — 31 items
BARRIER ADHS 3X4 INTERCEED (GAUZE/BANDAGES/DRESSINGS) IMPLANT
CLAMP CORD UMBIL (MISCELLANEOUS) IMPLANT
CLIP FILSHIE TUBAL LIGA STRL (Clip) ×6 IMPLANT
CLOTH BEACON ORANGE TIMEOUT ST (SAFETY) ×3 IMPLANT
DRSG OPSITE POSTOP 4X10 (GAUZE/BANDAGES/DRESSINGS) ×3 IMPLANT
DURAPREP 26ML APPLICATOR (WOUND CARE) ×3 IMPLANT
ELECT REM PT RETURN 9FT ADLT (ELECTROSURGICAL) ×3
ELECTRODE REM PT RTRN 9FT ADLT (ELECTROSURGICAL) ×1 IMPLANT
EXTRACTOR VACUUM KIWI (MISCELLANEOUS) IMPLANT
GLOVE BIO SURGEON STRL SZ 6.5 (GLOVE) ×2 IMPLANT
GLOVE BIO SURGEONS STRL SZ 6.5 (GLOVE) ×1
GLOVE BIOGEL PI IND STRL 7.0 (GLOVE) ×2 IMPLANT
GLOVE BIOGEL PI INDICATOR 7.0 (GLOVE) ×4
GOWN STRL REUS W/TWL LRG LVL3 (GOWN DISPOSABLE) ×6 IMPLANT
KIT ABG SYR 3ML LUER SLIP (SYRINGE) IMPLANT
NEEDLE HYPO 22GX1.5 SAFETY (NEEDLE) IMPLANT
NEEDLE HYPO 25X5/8 SAFETYGLIDE (NEEDLE) IMPLANT
NS IRRIG 1000ML POUR BTL (IV SOLUTION) ×3 IMPLANT
PACK C SECTION WH (CUSTOM PROCEDURE TRAY) ×3 IMPLANT
PAD OB MATERNITY 4.3X12.25 (PERSONAL CARE ITEMS) ×3 IMPLANT
PENCIL SMOKE EVAC W/HOLSTER (ELECTROSURGICAL) ×3 IMPLANT
RETRACTOR WND ALEXIS 25 LRG (MISCELLANEOUS) IMPLANT
RTRCTR WOUND ALEXIS 25CM LRG (MISCELLANEOUS)
SUT PLAIN 2 0 XLH (SUTURE) ×3 IMPLANT
SUT VIC AB 0 CT1 36 (SUTURE) ×18 IMPLANT
SUT VIC AB 2-0 CT1 27 (SUTURE) ×2
SUT VIC AB 2-0 CT1 TAPERPNT 27 (SUTURE) ×1 IMPLANT
SUT VIC AB 4-0 PS2 27 (SUTURE) ×3 IMPLANT
SYR CONTROL 10ML LL (SYRINGE) IMPLANT
TOWEL OR 17X24 6PK STRL BLUE (TOWEL DISPOSABLE) ×3 IMPLANT
TRAY FOLEY CATH SILVER 14FR (SET/KITS/TRAYS/PACK) IMPLANT

## 2015-08-06 NOTE — Progress Notes (Addendum)
Patient ID: Debra Carr, female   DOB: 08/05/1980, 35 y.o.   MRN: [id number]  Patient with failed induction with arrest of dilation. Patient was 5-6 for >24 hours with several attempts to reach adequate contractions.   The risks of cesarean section were discussed with the patient including but were not limited to: bleeding which may require transfusion or reoperation; infection which may require antibiotics; injury to bowel, bladder, ureters or other surrounding organs; injury to the fetus; need for additional procedures including hysterectomy in the event of a life-threatening hemorrhage; placental abnormalities wth subsequent pregnancies, incisional problems, thromboembolic phenomenon and other postoperative/anesthesia complications.    Patient consented for CS with BTL while Dr. Roselie Awkward in the room.   To OR when ready.   Caren Macadam, MD, MPH, ABFM Family Medicine, OB Fellow [facility]

## 2015-08-06 NOTE — Progress Notes (Signed)
Patient ID: Andilyn Roccia, female   DOB: 1980-05-21, 35 y.o.   MRN: [id number]   Labor Progress Note Foster Stogsdill is a 35 y.o. G3P0020 at [redacted]w[redacted]d presented for IOL for cHTN S: appears comfortable. Has nausea/vomiting and received phenergan. This was after her labetolol  O:  BP 143/72 mmHg  Pulse 81  Temp(Src) 97.8 F (36.6 C) (Oral)  Resp 18  Ht 5\' 2"  (1.575 m)  Wt 251 lb (113.853 kg)  BMI 45.90 kg/m2  SpO2 94%  LMP 11/05/2014 (Exact Date) EFM: 120/min/ no accels, no decels  CVE: Dilation: 5.5 Effacement (%): 60 Cervical Position: Anterior Station: -1 Presentation: Vertex Exam by:: Gonfa   A&P: 35 y.o. [id number] [redacted]w[redacted]d admitted for IOL for cHTN. BP within normal range. #Pre-eclampsia: BP within normal range. Continue Labetalol 200 BID, Procardia 30Xl #Labor: Pit@26 .  MVU 150-170. Will allow pitocin break for 2 hours. AROM 6/13@0630  #Pain: epidural #FWB: CAT 2- due to minimal variability #GBS: negative  #MOD: Vaginal delivery expected. Somewhat protracted labor but not yet in active labor.   Caren Macadam, MD 1:14 PM

## 2015-08-06 NOTE — Transfer of Care (Signed)
Immediate Anesthesia Transfer of Care Note  Patient: Debra Carr  Procedure(s) Performed: Procedure(s): CESAREAN SECTION (N/A)  Patient Location: PACU  Anesthesia Type:Epidural  Level of Consciousness: awake, alert  and oriented  Airway & Oxygen Therapy: Patient Spontanous Breathing  Post-op Assessment: Report given to RN and Post -op Vital signs reviewed and stable  Post vital signs: Reviewed and stable  Last Vitals:  Filed Vitals:   08/06/15 1945 08/06/15 1951  BP: 139/95 125/79  Pulse: 85 80  Temp:    Resp:      Last Pain:  Filed Vitals:   08/06/15 1951  PainSc: 0-No pain      Patients Stated Pain Goal: 8 ([id number])  Complications: No apparent anesthesia complications

## 2015-08-06 NOTE — Op Note (Signed)
Cesarean Section Procedure Note   Debra Carr  08/05/2015 - 08/06/2015  Indications: Failure to progress   Pre-operative Diagnosis: Failure to progress  Post-operative Diagnosis: Same, fibroid uterus  Surgeon: Surgeon(s) and Role:    * Woodroe Mode, MD - Primary    * Caren Macadam, MD - Assisting   Assistants: Dr. Lauretta Chester  Anesthesia: epidural   Procedure Details:  The patient was seen in the Holding Room. The risks, benefits, complications, treatment options, and expected outcomes were discussed with the patient. The patient concurred with the proposed plan, giving informed consent. identified as Debra Carr and the procedure verified as C-Section Delivery. A Time Out was held and the above information confirmed.  After induction of anesthesia, the patient was draped and prepped in the usual sterile manner. A transverse was made and carried down through the subcutaneous tissue to the fascia. Fascial incision was made and extended transversely. The fascia was separated from the underlying rectus tissue superiorly and inferiorly. The peritoneum was identified and entered. Peritoneal incision was extended longitudinally. The utero-vesical peritoneal reflection was incised transversely and the bladder flap was bluntly freed from the lower uterine segment. The uterus was noted to be rotated to the right and a 10 cm fibroid was noted on the right side of the fundus and another large fundal fibroid.A low transverse uterine incision was made. Delivered from cephalic presentation was a  Living newborn infant(s) female with Apgar scores of 8 at one minute and 9 at five minutes. Cord ph was not sent the umbilical cord was clamped and cut cord blood was obtained for evaluation. The placenta was removed Intact and appeared normal. The uterine outline, tubes and ovaries appeared abnormal - due to the fibroids and dextrorotation}. The uterine incision was closed with running  locked sutures of 0Vicryl, a second layer followed and hemostatic suture was applied.   Hemostasis was observed. The left Fallopian tube was ligated with Filshie clip. The right tube was identified after packing back bowel and Filshie clip was placed.  The peritoneum was closed with 2-0 Vicryl. The fascia was then reapproximated with running sutures of 0Vicryl. The subcuticular closure was performed using 2-0 plain gut. The skin was closed with 4-0Vicryl.   Instrument, sponge, and needle counts were correct prior the abdominal closure and were correct at the conclusion of the case.    Findings:Live born female infant, fibroid uterus, normal adnexa   Estimated Blood Loss: 800 ml  Total IV Fluids: 2100 ml   Urine Output: 150 CC OF bloody urine  Complications: no complications  Disposition: PACU - hemodynamically stable.   Maternal Condition: stable   Baby condition / location:  Couplet care / Skin to Skin  Attending Attestation: I was present and scrubbed for the entire procedure.   Signed: Surgeon(s): Woodroe Mode, MD Caren Macadam, MD  08/06/2015 9:37 PM

## 2015-08-06 NOTE — Progress Notes (Signed)
LABOR PROGRESS NOTE  Debra Carr is a 35 y.o. G3P0020 at [redacted]w[redacted]d  admitted for iol for chtn  Subjective: No ha, vision change, epigastric pain, or sob  Objective: BP 147/85 mmHg  Pulse 78  Temp(Src) 98 F (36.7 C) (Oral)  Resp 18  Ht 5\' 2"  (1.575 m)  Wt 251 lb (113.853 kg)  BMI 45.90 kg/m2  SpO2 99%  LMP 11/05/2014 (Exact Date) or  Filed Vitals:   08/05/15 2102 08/05/15 2131 08/05/15 2200 08/05/15 2231  BP: 144/84 147/85 143/83 147/85  Pulse: 74 78 77 78  Temp:      TempSrc:      Resp: 16 16 18 18   Height:      Weight:      SpO2: 99% 99%  99%    140/mod/+a/-d Dilation: 6 Effacement (%): 80 Cervical Position: Middle Station: -2 Presentation: Vertex Exam by:: Adrian Prince, RN  Labs: Lab Results  Component Value Date   WBC 11.8* 08/05/2015   HGB 9.7* 08/05/2015   HCT 29.2* 08/05/2015   MCV 90.4 08/05/2015   PLT 293 08/05/2015    Patient Active Problem List   Diagnosis Date Noted  . Chronic hypertension in pregnancy 08/05/2015  . Anemia affecting pregnancy, antepartum 03/12/2015  . Supervision of high risk pregnancy, antepartum 03/07/2015  . Chronic benign essential hypertension, antepartum 03/07/2015  . Advanced maternal age, primigravida, antepartum 03/07/2015  . Maternal obesity, antepartum 03/07/2015    Assessment / Plan: 35 y.o. G3P0020 at [redacted]w[redacted]d here for iol for chtn  Labor: s/p ripening, uptitrating pitocin, arom as needed Fetal Wellbeing:  Cat 1 Pain Control:  epidural Anticipated MOD:  Vag CHTN: with addition of procardia and continuation of labetalol bps now wnl to mild elevations. On Mg  Desma Maxim, MD 08/06/2015, 12:17 AM

## 2015-08-06 NOTE — Progress Notes (Signed)
LABOR PROGRESS NOTE  Debra Carr is a 35 y.o. G3P0020 at [redacted]w[redacted]d  admitted for iol for chtn  Subjective: No ha, vision change, epigastric pain, or sob  Objective: BP 125/63 mmHg  Pulse 85  Temp(Src) 98 F (36.7 C) (Oral)  Resp 18  Ht 5\' 2"  (1.575 m)  Wt 251 lb (113.853 kg)  BMI 45.90 kg/m2  SpO2 94%  LMP 11/05/2014 (Exact Date) or  Filed Vitals:   08/06/15 0205 08/06/15 0210 08/06/15 0301 08/06/15 0331  BP:   127/75 125/63  Pulse: 84 83 80 85  Temp:      TempSrc:      Resp:    18  Height:      Weight:      SpO2: 95% 94%      140/mod/+a/-d 5/70/ballotable  Labs: Lab Results  Component Value Date   WBC 11.8* 08/05/2015   HGB 9.7* 08/05/2015   HCT 29.2* 08/05/2015   MCV 90.4 08/05/2015   PLT 293 08/05/2015    Patient Active Problem List   Diagnosis Date Noted  . Chronic hypertension in pregnancy 08/05/2015  . Anemia affecting pregnancy, antepartum 03/12/2015  . Supervision of high risk pregnancy, antepartum 03/07/2015  . Chronic benign essential hypertension, antepartum 03/07/2015  . Advanced maternal age, primigravida, antepartum 03/07/2015  . Maternal obesity, antepartum 03/07/2015    Assessment / Plan: 35 y.o. G3P0020 at [redacted]w[redacted]d here for iol for chtn  Labor: s/p ripening, uptitrating pitocin, evaluated just now for arom but vertex ballotable, will uptitrate pitocin and try again in a little while Fetal Wellbeing:  Cat 1 Pain Control:  epidural Anticipated MOD:  Vag CHTN: with addition of procardia and continuation of labetalol bps now wnl to mild elevations. On Mg  Desma Maxim, MD 08/06/2015, 3:51 AM

## 2015-08-06 NOTE — Anesthesia Postprocedure Evaluation (Signed)
Anesthesia Post Note  Patient: Debra Carr  Procedure(s) Performed: Procedure(s) (LRB): CESAREAN SECTION (N/A)  Patient location during evaluation: PACU Anesthesia Type: Epidural Level of consciousness: awake Pain management: satisfactory to patient Vital Signs Assessment: post-procedure vital signs reviewed and stable Respiratory status: spontaneous breathing Cardiovascular status: blood pressure returned to baseline Postop Assessment: no headache and spinal receding Anesthetic complications: no     Last Vitals:  Filed Vitals:   08/06/15 2230 08/06/15 2234  BP: 158/86 146/83  Pulse: 73 71  Temp: 36.4 C   Resp: 16 18    Last Pain:  Filed Vitals:   08/06/15 2239  PainSc: 0-No pain   Pain Goal: Patients Stated Pain Goal: 8 (08/06/15 1224)               Lyndle Herrlich EDWARD

## 2015-08-06 NOTE — Progress Notes (Signed)
LABOR PROGRESS NOTE  Debra Carr is a 35 y.o. G3P0020 at [redacted]w[redacted]d  admitted for iol for chtn  Subjective: No ha, vision change, epigastric pain, or sob  Objective: BP 137/75 mmHg  Pulse 78  Temp(Src) 97.9 F (36.6 C) (Oral)  Resp 18  Ht 5\' 2"  (1.575 m)  Wt 251 lb (113.853 kg)  BMI 45.90 kg/m2  SpO2 94%  LMP 11/05/2014 (Exact Date) or  Filed Vitals:   08/06/15 0501 08/06/15 0531 08/06/15 0601 08/06/15 0631  BP: 126/65 130/76 138/84 137/75  Pulse: 84 79 76 78  Temp:   97.9 F (36.6 C)   TempSrc:   Oral   Resp: 18  18   Height:      Weight:      SpO2:        140/mod/+a/-d 5.5/50/-2 arom clear  Labs: Lab Results  Component Value Date   WBC 11.8* 08/05/2015   HGB 9.7* 08/05/2015   HCT 29.2* 08/05/2015   MCV 90.4 08/05/2015   PLT 293 08/05/2015    Patient Active Problem List   Diagnosis Date Noted  . Chronic hypertension in pregnancy 08/05/2015  . Anemia affecting pregnancy, antepartum 03/12/2015  . Supervision of high risk pregnancy, antepartum 03/07/2015  . Chronic benign essential hypertension, antepartum 03/07/2015  . Advanced maternal age, primigravida, antepartum 03/07/2015  . Maternal obesity, antepartum 03/07/2015    Assessment / Plan: 35 y.o. G3P0020 at [redacted]w[redacted]d here for iol for chtn  Labor: s/p ripening, just now s/p arom clear, pit @ 20, will hold there next couple of hours, iupc then if no cervical change Fetal Wellbeing:  Cat 1 Pain Control:  epidural Anticipated MOD:  Vag CHTN: with addition of procardia and continuation of labetalol bps now wnl to mild elevations. On Mg  Desma Maxim, MD 08/06/2015, 6:43 AM

## 2015-08-06 NOTE — Progress Notes (Signed)
Labor Progress Note Anastasia Cuccio is a 35 y.o. G3P0020 at [redacted]w[redacted]d presented for IOL for cHTN S: appears comfortable  O:  BP 138/77 mmHg  Pulse 85  Temp(Src) 98.3 F (36.8 C) (Oral)  Resp 18  Ht 5\' 2"  (1.575 m)  Wt 251 lb (113.853 kg)  BMI 45.90 kg/m2  SpO2 94%  LMP 11/05/2014 (Exact Date) EFM: 120/mod var/no decels  CVE: Dilation: 5.5 Effacement (%): 60 Cervical Position: Anterior Station: -1 Presentation: Vertex Exam by:: Jaran Sainz Placental position confirmed-left lateral position IUPC placed posteriorly. Amniotic return noted FSE: placed  A&P: 35 y.o. G3P0020 [redacted]w[redacted]d admitted for IOL for cHTN. BP within normal range. #Pre-eclampsia: BP within normal range #Labor: on pitocin #Pain: epidural #FWB: CAT-1 #GBS: negative  Mercy Riding, MD 10:08 AM

## 2015-08-07 ENCOUNTER — Encounter (HOSPITAL_COMMUNITY): Payer: Self-pay | Admitting: Obstetrics & Gynecology

## 2015-08-07 LAB — CBC
HEMATOCRIT: 23.9 % — AB (ref 36.0–46.0)
Hemoglobin: 7.9 g/dL — ABNORMAL LOW (ref 12.0–15.0)
MCH: 29.9 pg (ref 26.0–34.0)
MCHC: 33.1 g/dL (ref 30.0–36.0)
MCV: 90.5 fL (ref 78.0–100.0)
Platelets: 257 10*3/uL (ref 150–400)
RBC: 2.64 MIL/uL — ABNORMAL LOW (ref 3.87–5.11)
RDW: 14 % (ref 11.5–15.5)
WBC: 21.2 10*3/uL — ABNORMAL HIGH (ref 4.0–10.5)

## 2015-08-07 MED ORDER — HYDRALAZINE HCL 20 MG/ML IJ SOLN: 10.0000 mg | Freq: Once | INTRAMUSCULAR | Status: DC | PRN

## 2015-08-07 MED ORDER — FERROUS SULFATE 325 (65 FE) MG PO TABS
325.0000 mg | ORAL_TABLET | Freq: Two times a day (BID) | ORAL | Status: DC
  Administered 2015-08-07 – 2015-08-09 (×4): 325 mg via ORAL
  Filled 2015-08-07 (×4): qty 1

## 2015-08-07 MED ORDER — ENALAPRIL MALEATE 5 MG PO TABS
5.0000 mg | ORAL_TABLET | Freq: Every day | ORAL | Status: DC
  Administered 2015-08-07 – 2015-08-09 (×3): 5 mg via ORAL
  Filled 2015-08-07 (×3): qty 1

## 2015-08-07 MED ORDER — HYDROCHLOROTHIAZIDE 25 MG PO TABS
25.0000 mg | ORAL_TABLET | Freq: Every day | ORAL | Status: DC
  Administered 2015-08-08 – 2015-08-09 (×2): 25 mg via ORAL
  Filled 2015-08-07 (×2): qty 1

## 2015-08-07 MED ORDER — LABETALOL HCL 5 MG/ML IV SOLN
20.0000 mg | INTRAVENOUS | Status: AC | PRN
  Administered 2015-08-07: 40 mg via INTRAVENOUS
  Administered 2015-08-07: 60 mg via INTRAVENOUS
  Administered 2015-08-07: 20 mg via INTRAVENOUS
  Filled 2015-08-07: qty 12
  Filled 2015-08-07: qty 4
  Filled 2015-08-07: qty 8
  Filled 2015-08-07: qty 4

## 2015-08-07 MED ORDER — NIFEDIPINE ER 60 MG PO TB24: 60.0000 mg | ORAL_TABLET | Freq: Two times a day (BID) | ORAL | Status: DC

## 2015-08-07 NOTE — Progress Notes (Signed)
Subjective: Postpartum Day 1: Cesarean Delivery Patient reports + flatus and no problems voiding.    Objective: Vital signs in last 24 hours: Temp:  [97.5 F (36.4 C)-98.7 F (37.1 C)] 98.7 F (37.1 C) (06/14 1000) Pulse Rate:  [67-93] 68 (06/14 1000) Resp:  [13-28] 24 (06/14 1000) BP: (122-173)/(70-100) 136/74 mmHg (06/14 1000) SpO2:  [92 %-100 %] 95 % (06/14 1000) I/O 5533/4160  Physical Exam:  General: alert, cooperative and appears stated age 35: appropriate Uterine Fundus: firm Incision: no significant drainage DVT Evaluation: No evidence of DVT seen on physical exam.   Recent Labs  08/06/15 2200 08/07/15 0848  HGB 9.2* 7.9*  HCT 28.0* 23.9*    Assessment/Plan: Status post Cesarean section. Doing well postoperatively.  Increase BP meds and add Vasotec Continue current care.  PRATT,TANYA S 08/07/2015, 10:22 AM

## 2015-08-07 NOTE — Progress Notes (Signed)
Post Partum Day 1, POD1 Subjective:  Debra Carr is a 35 y.o. [id number] [redacted]w[redacted]d s/p pLTCS for arrest of dilation and BTL. She was originally an IOL for cHTN with severe range BP's. Patient had severe range BP's overnight and received labetalol x3. Recent BP's have been systolic A999333 and diastolic A999333. Patient reports RUQ tenderness but says that she has a fibroid in that area that causes pain. She denies nausea, vomiting, dizziness, SOB, headache, or visual symptoms.  Pain is well controlled.  She has not had flatus.  Lochia Minimal. Method of Feeding: breast and bottle.  Objective: Blood pressure 131/72, pulse 71, temperature 98.3 F (36.8 C), temperature source Oral, resp. rate 28, height 5\' 2"  (1.575 m), weight 113.853 kg (251 lb), last menstrual period 11/05/2014, SpO2 98 %, unknown if currently breastfeeding.  Physical Exam:  General: alert, cooperative and no distress Chest: normal WOB Heart: Regular rate Abdomen: RUQ tenderness DVT Evaluation: no evidence of DVT seen on physical exam. Extremities: no edema   Recent Labs  08/06/15 2200 08/07/15 0848  HGB 9.2* 7.9*  HCT 28.0* 23.9*    Assessment/Plan:  ASSESSMENT: Debra Carr is a 35 y.o. [id number] [redacted]w[redacted]d s/p pLTCS for arrest of dilation and BTL. She was originally an IOL for cHTN with severe range BP's.  #cHTN with severe-range features: BP's were in the severe ranges overnight, and patient received Labetalol x3. Recently, systolic A999333 and diastolic A999333. Patient reports RUQ tenderness but this may be due to a fibroid in the area. Preeclampsia labs were negative on admission. She does not currently have headache or visual symptoms. -Procardia 30 mg BID -Mg 24 hrs postpartum -PRN labetalol for severe range pressures  #Anemia: Hgb dropped 9.2-7.9. Patient is currently asymptomatic. -consider iron supplementation  #Postpartum Care Plan for discharge 6/15 or 6/16 Continue routine PP care Breastfeeding  support PRN

## 2015-08-07 NOTE — Anesthesia Postprocedure Evaluation (Signed)
Anesthesia Post Note  Patient: Debra Carr  Procedure(s) Performed: Procedure(s) (LRB): CESAREAN SECTION (N/A)  Patient location during evaluation: [facility] Anesthesia Type: Epidural Level of consciousness: awake, awake and alert and oriented Pain management: pain level controlled Vital Signs Assessment: post-procedure vital signs reviewed and stable Respiratory status: spontaneous breathing, nonlabored ventilation and respiratory function stable Cardiovascular status: stable Postop Assessment: no headache, no backache, patient able to bend at knees, no signs of nausea or vomiting and adequate PO intake     Last Vitals:  Filed Vitals:   08/07/15 0639 08/07/15 0740  BP: 152/81 131/72  Pulse: 72 71  Temp:  36.8 C  Resp: 18 18    Last Pain:  Filed Vitals:   08/07/15 0750  PainSc: 0-No pain   Pain Goal: Patients Stated Pain Goal: 8 (08/06/15 1224)               Mishaal Lansdale Hristova

## 2015-08-07 NOTE — Addendum Note (Signed)
Addendum  created 08/07/15 0911 by Hewitt Blade, CRNA   Modules edited: Clinical Notes   Clinical Notes:  File: [id number]

## 2015-08-08 DIAGNOSIS — Z98891 History of uterine scar from previous surgery: Secondary | ICD-10-CM | POA: Insufficient documentation

## 2015-08-08 MED ORDER — ACETAMINOPHEN 325 MG PO TABS: 650.0000 mg | ORAL_TABLET | ORAL | Status: DC | PRN

## 2015-08-08 MED ORDER — HYDROCHLOROTHIAZIDE 25 MG PO TABS: 25.0000 mg | ORAL_TABLET | Freq: Every day | ORAL | Status: DC

## 2015-08-08 MED ORDER — ENALAPRIL MALEATE 5 MG PO TABS: 5.0000 mg | ORAL_TABLET | Freq: Every day | ORAL | Status: DC

## 2015-08-08 MED ORDER — OXYCODONE-ACETAMINOPHEN 5-325 MG PO TABS: 1.0000 | ORAL_TABLET | ORAL | Status: DC | PRN

## 2015-08-08 NOTE — Discharge Instructions (Signed)
Care After Delivery Refer to this sheet in the next few weeks. These discharge instructions provide you with information on caring for yourself after delivery. Your health care provider may also give you specific instructions. Your treatment has been planned according to the most current medical practices available, but problems sometimes occur. Call your health care provider if you have any problems or questions after you go home. HOME CARE INSTRUCTIONS  Take over-the-counter or prescription medicines only as directed by your health care provider or pharmacist.  Do not drink alcohol, especially if you are breastfeeding or taking medicine to relieve pain.  Do not chew or smoke tobacco.  Do not use illegal drugs.  Continue to use good perineal care. Good perineal care includes:  Wiping your perineum from front to back.  Keeping your perineum clean.  Do not use tampons or douche until your health care provider says it is okay.  Shower, wash your hair, and take tub baths as directed by your health care provider.  Wear a well-fitting bra that provides breast support.  Eat healthy foods.  Drink enough fluids to keep your urine clear or pale yellow.  Eat high-fiber foods such as whole grain cereals and breads, brown rice, beans, and fresh fruits and vegetables every day. These foods may help prevent or relieve constipation.  Follow your health care provider's recommendations regarding resumption of activities such as climbing stairs, driving, lifting, exercising, or traveling.  Talk to your health care provider about resuming sexual activities. Resumption of sexual activities is dependent upon your risk of infection, your rate of healing, and your comfort and desire to resume sexual activity.  Try to have someone help you with your household activities and your newborn for at least a few days after you leave the hospital.  Rest as much as possible. Try to rest or take a nap when your  newborn is sleeping.  Increase your activities gradually.  Keep all of your scheduled postpartum appointments. It is very important to keep your scheduled follow-up appointments. At these appointments, your health care provider will be checking to make sure that you are healing physically and emotionally. SEEK MEDICAL CARE IF:   You are passing large clots from your vagina. Save any clots to show your health care provider.  You have a foul smelling discharge from your vagina.  You have trouble urinating.  You are urinating frequently.  You have pain when you urinate.  You have a change in your bowel movements.  You have increasing redness, pain, or swelling near your vaginal incision (episiotomy) or vaginal tear.  You have pus draining from your episiotomy or vaginal tear.  Your episiotomy or vaginal tear is separating.  You have painful, hard, or reddened breasts.  You have a severe headache.  You have blurred vision or see spots.  You feel sad or depressed.  You have thoughts of hurting yourself or your newborn.  You have questions about your care, the care of your newborn, or medicines.  You are dizzy or light-headed.  You have a rash.  You have nausea or vomiting.  You were breastfeeding and have not had a menstrual period within 12 weeks after you stopped breastfeeding.  You are not breastfeeding and have not had a menstrual period by the 12th week after delivery.  You have a fever. SEEK IMMEDIATE MEDICAL CARE IF:   You have persistent pain.  You have chest pain.  You have shortness of breath.  You faint.  You have  leg pain.  You have stomach pain.  Your vaginal bleeding saturates two or more sanitary pads in 1 hour.   This information is not intended to replace advice given to you by your health care provider. Make sure you discuss any questions you have with your health care provider.   Document Released: 02/07/2000 Document Revised: 10/31/2014  Document Reviewed: 10/07/2011 Elsevier Interactive Patient Education [employer].

## 2015-08-08 NOTE — Progress Notes (Signed)
Post Partum Day 2, POD2 Subjective:  Randie Sokolow is a 35 y.o. [id number] s/p pLTCS at 41w1dfor arrest of dilation and BTL. She was originally an IOL for cHTN with severe range BP's.  She denies nausea, vomiting, dizziness, SOB, headache, or visual symptoms.  Pain is well controlled.  She has had flatus.  Lochia Minimal. Method of Feeding: breast and bottle.  Objective: Blood pressure 156/87, pulse 85, temperature 98.2 F (36.8 C), temperature source Oral, resp. rate 16, height 5\' 2"  (1.575 m), weight 249 lb 0.1 oz (112.948 kg), last menstrual period 11/05/2014, SpO2 100 %, unknown if currently breastfeeding. Temp:  [98 F (36.7 C)-98.9 F (37.2 C)] 98.2 F (36.8 C) (06/15 0900) Pulse Rate:  [70-85] 85 (06/15 0900) Resp:  [16-22] 16 (06/15 0900) BP: (124-156)/(61-87) 156/87 mmHg (06/15 0959) SpO2:  [98 %-100 %] 100 % (06/15 0900) Weight:  [249 lb 0.1 oz (112.948 kg)] 249 lb 0.1 oz (112.948 kg) (06/15 0603)  Physical Exam:  General: alert, cooperative and no distress Chest: normal WOB Heart: Regular rate Abdomen: NT DVT Evaluation: no evidence of DVT seen on physical exam. Extremities: no edema   Recent Labs  08/06/15 2200 08/07/15 0848  HGB 9.2* 7.9*  HCT 28.0* 23.9*    Assessment/Plan:  ASSESSMENT: Tristia Dario is a 35 y.o. [id number] POD#2 s/p pLTCS for arrest of dilation and BTL. She was originally an IOL for cHTN with severe range BP's.  #cHTN with severe-range features: Stable BP, no concerning symptoms. Continue HCTZ 25 mg QD and Vasotec 5 mg Q  #Anemia: Hgb dropped 9.2-7.9. Patient is currently asymptomatic. On  iron supplementation  #Postpartum Care Plan for discharge 6/16 ( Baby unable to be discharged today, patient opted to stay an extra day) Continue routine PP care Breastfeeding support PRN    Verita Schneiders, MD, FACOG Attending Obstetrician & Gynecologist, [place] for [facility]

## 2015-08-08 NOTE — Discharge Summary (Signed)
OB Discharge Summary     Patient Name: Debra Carr DOB: [id number] MRN: [id number]  Date of admission: 08/05/2015 Delivering MD: Woodroe Mode   Date of discharge: 08/09/2015  Admitting diagnosis: INDUCTION Intrauterine pregnancy: [redacted]w[redacted]d     Secondary diagnosis:  Active Problems:   Supervision of high risk pregnancy, antepartum   Chronic benign essential hypertension, antepartum   Advanced maternal age, primigravida, antepartum   Maternal obesity, antepartum   Anemia affecting pregnancy, antepartum   Chronic hypertension in pregnancy   S/P primary low transverse C-section   Encounter for sterilization  Additional problems: none     Discharge diagnosis: Term Pregnancy Delivered                                                                                                Post partum procedures:BTL  Augmentation: AROM, Pitocin and Cytotec  Complications: None  Hospital course:  Induction of Labor With Cesarean Section  35 y.o. yo [id number] at [redacted]w[redacted]d was admitted to the hospital 08/05/2015 for induction of labor. Patient had a labor course significant for arrest of dilation. The patient went for cesarean section due to Arrest of Dilation, and delivered a Viable infant. APGAR score were 8 and 9. Membrane Rupture Time/Date:6:38 AM ,08/06/2015  . Patient had BTL intraoperatively.  Details of operation can be found in separate operative note.  Patient had an uncomplicated postpartum course. She is ambulating, tolerating a regular diet, passing flatus, and urinating well.  Patient is discharged home in stable condition on 08/09/2015.                                 CHTN: mild range blood pressure prior to discharge. Started on Enalapril and HCTZ POD#2 and discharged on those medications. Baby love ordered at time of discharge.   Physical exam  Filed Vitals:   08/09/15 0500 08/09/15 0800 08/09/15 0900 08/09/15 1000  BP: 174/82 149/69 167/80 141/69  Pulse: 74 81 85 81  Temp: 98.6 F  (37 C)   97.9 F (36.6 C)  TempSrc: Oral   Oral  Resp: 18   18  Height:      Weight: 250 lb 4 oz (113.513 kg)     SpO2: 100%      General: alert, cooperative and no distress Lochia: appropriate Uterine Fundus: firm Incision: Dressing is clean, dry, and intact DVT Evaluation: No evidence of DVT seen on physical exam. Labs: Lab Results  Component Value Date   WBC 21.2* 08/07/2015   HGB 7.9* 08/07/2015   HCT 23.9* 08/07/2015   MCV 90.5 08/07/2015   PLT 257 08/07/2015   CMP Latest Ref Rng 08/05/2015  Glucose 65 - 99 mg/dL 93  BUN 6 - 20 mg/dL 7  Creatinine 0.44 - 1.00 mg/dL 0.42(L)  Sodium 135 - 145 mmol/L 135  Potassium 3.5 - 5.1 mmol/L 3.6  Chloride 101 - 111 mmol/L 105  CO2 22 - 32 mmol/L 21(L)  Calcium 8.9 - 10.3 mg/dL 8.3(L)  Total Protein 6.5 - 8.1 g/dL 7.5  Total  Bilirubin 0.3 - 1.2 mg/dL 0.8  Alkaline Phos 38 - 126 U/L 98  AST 15 - 41 U/L 24  ALT 14 - 54 U/L 16    Discharge instruction: per After Visit Summary and "Baby and Me Booklet".  After visit meds:    Medication List    STOP taking these medications        labetalol 200 MG tablet  Commonly known as:  NORMODYNE      TAKE these medications        acetaminophen 325 MG tablet  Commonly known as:  TYLENOL  Take 2 tablets (650 mg total) by mouth every 4 (four) hours as needed (for pain scale < 4).     enalapril 10 MG tablet  Commonly known as:  VASOTEC  Take 1 tablet (10 mg total) by mouth daily.     ferrous sulfate 325 (65 FE) MG tablet  Take 1 tablet (325 mg total) by mouth daily with breakfast.     hydrochlorothiazide 25 MG tablet  Commonly known as:  HYDRODIURIL  Take 1 tablet (25 mg total) by mouth daily.     oxyCODONE-acetaminophen 5-325 MG tablet  Commonly known as:  PERCOCET/ROXICET  Take 1-2 tablets by mouth every 4 (four) hours as needed (pain scale > 7).     Prenatal Vitamins 0.8 MG tablet  Take 1 tablet by mouth daily.        Diet: routine diet  Activity: Advance as  tolerated. Pelvic rest for 6 weeks.   Outpatient follow up:2 weeks for cHTN postpartum  Follow-up Information    Follow up with [facility] In 2 weeks.   Specialty:  Obstetrics and Gynecology   Why:  Follow up on HTN postpartum   Contact information:   [place] [phone]     Postpartum contraception: Tubal Ligation done  Newborn Data: Live born female  Birth Weight: 7 lb 1.4 oz (3215 g) APGAR: 8, 9  Baby Feeding: Bottle and Breast Disposition:home with mother   08/09/2015 Mercy Riding, MD

## 2015-08-09 MED ORDER — ENALAPRIL MALEATE 5 MG PO TABS
5.0000 mg | ORAL_TABLET | Freq: Once | ORAL | Status: AC
  Administered 2015-08-09: 5 mg via ORAL
  Filled 2015-08-09: qty 1

## 2015-08-09 MED ORDER — ENALAPRIL MALEATE 10 MG PO TABS
10.0000 mg | ORAL_TABLET | Freq: Every day | ORAL | Status: DC
  Filled 2015-08-09 (×2): qty 1

## 2015-08-09 MED ORDER — ENALAPRIL MALEATE 10 MG PO TABS: 10.0000 mg | ORAL_TABLET | Freq: Every day | ORAL | Status: DC

## 2015-08-09 NOTE — Progress Notes (Signed)
Pt verbalizes understanding of d/c instructions, medications, follow up appts, when to seek medical attention, and belongings policy. No IV at this time. Pt has no questions. Pt was encouraged to check room for belongings. [facility] nurse was called and will follow up on BP check early next week per MD request. Pt ambulated to the main entrance. NT walked with pt, her SO, and infant who was carried in car seat. SO did not bring car around and had pt walk across parking lot to car, NT walked with them. FOB was short with NT when she spoke to them about adjusting car seat. Debra Carr

## 2015-08-09 NOTE — Lactation Note (Signed)
This note was copied from a baby's chart. Lactation Consultation Note  Patient Name: Debra Carr [name] Date: 08/09/2015 Reason for consult: Initial assessment   Spoke with mom, she reports she is planning to bottle feed and declined needing assistance.   Maternal Data    Feeding    LATCH Score/Interventions                      Lactation Tools Discussed/Used     Consult Status Consult Status: Complete    Debra Carr 08/09/2015, 10:18 AM

## 2015-08-09 NOTE — Progress Notes (Addendum)
Called to report BP 174 /82 to Dr. Konrad Felix, Resident. Give Daily dose of Vasotec(Enalapril) 5mg  now.

## 2015-08-13 ENCOUNTER — Ambulatory Visit (INDEPENDENT_AMBULATORY_CARE_PROVIDER_SITE_OTHER): Payer: Medicaid Other | Admitting: *Deleted

## 2015-08-13 ENCOUNTER — Telehealth: Payer: Self-pay

## 2015-08-13 VITALS — Temp 98.9°F

## 2015-08-13 DIAGNOSIS — R300 Dysuria: Secondary | ICD-10-CM

## 2015-08-13 LAB — POCT URINALYSIS DIP (DEVICE)
BILIRUBIN URINE: NEGATIVE
GLUCOSE, UA: NEGATIVE mg/dL
Ketones, ur: NEGATIVE mg/dL
LEUKOCYTES UA: NEGATIVE
NITRITE: NEGATIVE
Protein, ur: >=300 mg/dL — AB
Specific Gravity, Urine: 1.02 (ref 1.005–1.030)
Urobilinogen, UA: 4 mg/dL — ABNORMAL HIGH (ref 0.0–1.0)
pH: 7 (ref 5.0–8.0)

## 2015-08-13 NOTE — Telephone Encounter (Signed)
Received call on nurse line patient just delivered on 08/09/15. She thinks she may have a UTI and would like to be check. I have advised patient to come into the clinic so we can obtain a urine specimen. Pt verbalizes understanding and will follow up here at the clinic.

## 2015-08-13 NOTE — Progress Notes (Signed)
Pt had C/S on 6/16 and has started having burning with urination.  Urinalysis was negative except for large blood - most likely lochia. Specimen will be sent for culture in light of pt's symptoms. Pt encouraged to increase po fluids and notify us if her symptoms worsen.

## 2015-08-15 LAB — URINE CULTURE
Colony Count: NO GROWTH
Organism ID, Bacteria: NO GROWTH

## 2015-08-17 NOTE — H&P (Signed)
Patient Information    Patient Name Sex DOB SSN   Debra Carr, Debra Carr Female 1980/08/30 [id number]    H&P by Keitha Butte, CNM at 08/09/2015 11:55 AM    Author: Keitha Butte, CNM Service: Obstetrics Author Type: Certified Nurse Midwife   Filed: 08/17/2015 5:18 PM Note Time: 08/09/2015 11:55 AM Status: Cosign Needed   Editor: Keitha Butte, CNM (Certified Nurse Midwife) Cosign Required: Yes   Expand All Collapse All   LABOR ADMISSION HISTORY AND PHYSICAL  Debra Carr is a 35 y.o. female G68P0020 with IUP at [redacted]w[redacted]d by 7w [place] presenting for scheduled IOL for cHTN. She reports no contraction. Fetal movement present and at baseline. Denies fluid leak or gush, vaginal bleeding, headaches, blurry vision, RUQ pain or peripheral edema. She plans on breast and bottle feeding. She request Lvg-IUD for birth control.   Dating: By 7 wk US---> Estimated Date of Delivery: 08/12/15  Sono:   @[redacted]w[redacted]d , CWD, normal anatomy, cephalic presentation, longitudinal lie, 2798 g, 83% EFW   Prenatal History/Complications: Clinic [facility] Prenatal Labs  Dating 7 wk Blood type: A/POS/-- (01/12 [id number]) A POS  Genetic Screen NIPS: nml Antibody:NEG (01/12 0927) NEG  Anatomic [place] normal Rubella: 1.11 (01/12 0927)IMMUNE  GTT Early: 62 Third trimester: 98 RPR: Non Reactive (04/20 1506) NR  Flu vaccine declined HBsAg: NEGATIVE (01/12 0927) NEG  TDaP vaccine 05/13/15  HIV: NONREACTIVE (03/20 0919) NR  Baby Food  Bottle  [id number] (05/25 0000)neg  Contraception BTL papers Pap: NEEDS  Circumcision female   Pediatrician Not picked yet   Support Person Sister Research scientist (life sciences))    Past Medical History: Past Medical History  Diagnosis Date  . Hypertension   . Ectopic pregnancy   . Thyroid disorder     Past Surgical  History: Past Surgical History  Procedure Laterality Date  . Laparoscopy      Obstetrical History: OB History    Gravida Para Term Preterm AB TAB SAB Ectopic Multiple Living   3    2  1 1   0      Social History: Social History   Social History  . Marital Status: Single    Spouse Name: N/A  . Number of Children: N/A  . Years of Education: N/A   Social History Main Topics  . Smoking status: Never Smoker   . Smokeless tobacco: Never Used  . Alcohol Use: No  . Drug Use: No  . Sexual Activity: Yes    Birth Control/ Protection: None   Other Topics Concern  . None   Social History Narrative    Family History: Family History  Problem Relation Age of Onset  . Hypertension Mother     Allergies: No Known Allergies  Prescriptions prior to admission  Medication Sig Dispense Refill Last Dose  . aspirin 81 MG tablet Take 1 tablet (81 mg total) by mouth daily. 30 tablet 6 Taking  . ferrous sulfate 325 (65 FE) MG tablet Take 1 tablet (325 mg total) by mouth daily with breakfast. 30 tablet 3 Taking  . labetalol (NORMODYNE) 200 MG tablet Take 2 tablets (400 mg total) by mouth 3 (three) times daily. 180 tablet 3 Taking  . Prenatal Multivit-Min-Fe-FA (PRENATAL VITAMINS) 0.8 MG tablet Take 1 tablet by mouth daily. 30 tablet 6 Taking     Review of Systems  Blood pressure 154/96, pulse 80, temperature 98.3 F (36.8 C), temperature source Oral, resp. rate 18, height 5\' 2"  (1.575 m), weight 251 lb (113.853 kg), last menstrual period 11/05/2014. GEN: appearance:  alert, cooperative and appears stated age RESP: clear to auscultation bilaterally, no increased WOB CVS:: regular rate and rhythm, no murmurs, no sign of DVT, +2 DP GI: soft, non-tender; bowel  sounds normal MSK: WWP, Homans sign is negative,   Pelvic Exam: Cervical exam: Dilation: Fingertip Effacement (%): Thick Station: -2, -3 Exam by:: a. thacker rn Presentation: cephalic Uterine activityNone  Fetal monitoringBaseline: 130 bpm, Variability: Good {> 6 bpm), Accelerations: Reactive and Decelerations: Absent  Prenatal labs: ABO, Rh: A/POS/-- (01/12 0927) Antibody: NEG (01/12 0927) Rubella: immune RPR: Non Reactive (04/20 1506)  HBsAg: NEGATIVE (01/12 0927)  HIV: NONREACTIVE (03/20 0919)  GBS: Negative (05/25 0000)  1 hr Glucola 98 Genetic screening NIPS normal Anatomy US normal  Prenatal Transfer Tool  Maternal Diabetes: No Genetic Screening: Normal Maternal Ultrasounds/Referrals: Normal Fetal Ultrasounds or other Referrals: None Maternal Substance Abuse: No Significant Maternal Medications: Meds include: Other: Labetoalol Significant Maternal Lab Results: None  Results for orders placed or performed during the hospital encounter of 08/05/15 (from the past 24 hour(s))  CBC   Collection Time: 08/05/15 1:05 AM  Result Value Ref Range   WBC 12.1 (H) 4.0 - 10.5 K/uL   RBC 3.10 (L) 3.87 - 5.11 MIL/uL   Hemoglobin 9.3 (L) 12.0 - 15.0 g/dL   HCT 27.8 (L) 36.0 - 46.0 %   MCV 89.7 78.0 - 100.0 fL   MCH 30.0 26.0 - 34.0 pg   MCHC 33.5 30.0 - 36.0 g/dL   RDW 13.7 11.5 - 15.5 %   Platelets 304 150 - 400 K/uL    Patient Active Problem List   Diagnosis Date Noted  . Chronic hypertension in pregnancy 08/05/2015  . Anemia affecting pregnancy, antepartum 03/12/2015  . Supervision of high risk pregnancy, antepartum 03/07/2015  . Chronic benign essential hypertension, antepartum 03/07/2015  . Advanced maternal age, primigravida, antepartum 03/07/2015  . Maternal obesity, antepartum 03/07/2015    Assessment: Debra Carr is a 35 y.o. G3P0020 at [redacted]w[redacted]d here for IOL for cHTN. BP in mild range. No severe features. Urine protein. UA with nitrite and LE 4 days ago. No culture sent.   #cHTN: continue home labetalol. PIH labs #Labor: Cytotec for induction #Pain:IV meds as needed. #FWB: CAT-1 #ID: GBS negative. UA and urine culture. CTX after urine collection. #MOF: Breast and Bottle #MOC:Mirena #Circ: n/a  Mercy Riding 08/05/2015, 1:27 AM

## 2015-08-17 NOTE — H&P (Signed)
LABOR ADMISSION HISTORY AND PHYSICAL  Breindy Carr is a 35 y.o. female G3P0020 with IUP at [redacted]w[redacted]d by 7w [place] presenting for scheduled IOL for cHTN. She reports no contraction. Fetal movement present and at baseline. Denies fluid leak or gush, vaginal bleeding, headaches, blurry vision, RUQ pain or peripheral edema. She plans on breast and bottle feeding. She request Lvg-IUD for birth control.   Dating: By 7 wk US---> Estimated Date of Delivery: 08/12/15  Sono:   @[redacted]w[redacted]d , CWD, normal anatomy, cephalic presentation, longitudinal lie, 2798 g, 83% EFW   Prenatal History/Complications: Clinic [facility] Prenatal Labs  Dating 7 wk Blood type: A/POS/-- (01/12 [id number]) A POS  Genetic Screen NIPS: nml Antibody:NEG (01/12 0927) NEG  Anatomic [place] normal Rubella: 1.11 (01/12 0927)IMMUNE  GTT Early: 1 Third trimester: 98 RPR: Non Reactive (04/20 1506) NR  Flu vaccine declined HBsAg: NEGATIVE (01/12 0927) NEG  TDaP vaccine 05/13/15  HIV: NONREACTIVE (03/20 0919) NR  Baby Food  Bottle  [id number] (05/25 0000)neg  Contraception BTL papers Pap: NEEDS  Circumcision female   Pediatrician Not picked yet   Support Person Sister Research scientist (life sciences))    Past Medical History: Past Medical History  Diagnosis Date  . Hypertension   . Ectopic pregnancy   . Thyroid disorder     Past Surgical History: Past Surgical History  Procedure Laterality Date  . Laparoscopy      Obstetrical History: OB History    Gravida Para Term Preterm AB TAB SAB Ectopic Multiple Living   3    2  1 1   0      Social History: Social History   Social History  . Marital Status: Single    Spouse Name: N/A  . Number of Children: N/A  . Years of Education: N/A   Social History Main Topics  . Smoking status: Never Smoker   . Smokeless  tobacco: Never Used  . Alcohol Use: No  . Drug Use: No  . Sexual Activity: Yes    Birth Control/ Protection: None   Other Topics Concern  . None   Social History Narrative    Family History: Family History  Problem Relation Age of Onset  . Hypertension Mother     Allergies: No Known Allergies  Prescriptions prior to admission  Medication Sig Dispense Refill Last Dose  . aspirin 81 MG tablet Take 1 tablet (81 mg total) by mouth daily. 30 tablet 6 Taking  . ferrous sulfate 325 (65 FE) MG tablet Take 1 tablet (325 mg total) by mouth daily with breakfast. 30 tablet 3 Taking  . labetalol (NORMODYNE) 200 MG tablet Take 2 tablets (400 mg total) by mouth 3 (three) times daily. 180 tablet 3 Taking  . Prenatal Multivit-Min-Fe-FA (PRENATAL VITAMINS) 0.8 MG tablet Take 1 tablet by mouth daily. 30 tablet 6 Taking     Review of Systems  Blood pressure 154/96, pulse 80, temperature 98.3 F (36.8 C), temperature source Oral, resp. rate 18, height 5\' 2"  (1.575 m), weight 251 lb (113.853 kg), last menstrual period 11/05/2014. GEN: appearance: alert, cooperative and appears stated age RESP: clear to auscultation bilaterally, no increased WOB CVS:: regular rate and rhythm, no murmurs, no sign of DVT, +2 DP GI: soft, non-tender; bowel sounds normal MSK: WWP, Homans sign is negative,   Pelvic Exam: Cervical exam: Dilation: Fingertip Effacement (%): Thick Station: -2, -3 Exam by:: a. thacker rn Presentation: cephalic Uterine activityNone  Fetal monitoringBaseline: 130 bpm, Variability: Good {> 6 bpm), Accelerations: Reactive and Decelerations: Absent  Prenatal labs:  ABO, Rh: A/POS/-- (01/12 [id number]) Antibody: NEG (01/12 0927) Rubella: immune RPR: Non Reactive (04/20 1506)  HBsAg: NEGATIVE (01/12 0927)  HIV: NONREACTIVE (03/20 0919)  GBS: Negative (05/25 0000)  1 hr Glucola 98 Genetic screening NIPS normal Anatomy  US normal  Prenatal Transfer Tool  Maternal Diabetes: No Genetic Screening: Normal Maternal Ultrasounds/Referrals: Normal Fetal Ultrasounds or other Referrals: None Maternal Substance Abuse: No Significant Maternal Medications: Meds include: Other: Labetoalol Significant Maternal Lab Results: None  Results for orders placed or performed during the hospital encounter of 08/05/15 (from the past 24 hour(s))  CBC   Collection Time: 08/05/15 1:05 AM  Result Value Ref Range   WBC 12.1 (H) 4.0 - 10.5 K/uL   RBC 3.10 (L) 3.87 - 5.11 MIL/uL   Hemoglobin 9.3 (L) 12.0 - 15.0 g/dL   HCT 27.8 (L) 36.0 - 46.0 %   MCV 89.7 78.0 - 100.0 fL   MCH 30.0 26.0 - 34.0 pg   MCHC 33.5 30.0 - 36.0 g/dL   RDW 13.7 11.5 - 15.5 %   Platelets 304 150 - 400 K/uL    Patient Active Problem List   Diagnosis Date Noted  . Chronic hypertension in pregnancy 08/05/2015  . Anemia affecting pregnancy, antepartum 03/12/2015  . Supervision of high risk pregnancy, antepartum 03/07/2015  . Chronic benign essential hypertension, antepartum 03/07/2015  . Advanced maternal age, primigravida, antepartum 03/07/2015  . Maternal obesity, antepartum 03/07/2015    Assessment: Debra Carr is a 35 y.o. G3P0020 at [redacted]w[redacted]d here for IOL for cHTN. BP in mild range. No severe features. Urine protein. UA with nitrite and LE 4 days ago. No culture sent.   #cHTN: continue home labetalol. PIH labs #Labor: Cytotec for induction #Pain:IV meds as needed. #FWB: CAT-1 #ID: GBS negative. UA and urine culture. CTX after urine collection. #MOF: Breast and Bottle #MOC:Mirena #Circ: n/a  Mercy Riding 08/05/2015, 1:27 AM

## 2015-08-18 ENCOUNTER — Inpatient Hospital Stay (HOSPITAL_COMMUNITY)
Admission: AD | Admit: 2015-08-18 | Discharge: 2015-08-18 | Disposition: A | Discharge status: Home / Self Care | Payer: Medicaid Other | Source: Ambulatory Visit | Attending: Obstetrics & Gynecology | Admitting: Obstetrics & Gynecology

## 2015-08-18 ENCOUNTER — Encounter (HOSPITAL_COMMUNITY): Payer: Self-pay | Admitting: *Deleted

## 2015-08-18 DIAGNOSIS — R300 Dysuria: Secondary | ICD-10-CM | POA: Insufficient documentation

## 2015-08-18 DIAGNOSIS — I10 Essential (primary) hypertension: Secondary | ICD-10-CM | POA: Insufficient documentation

## 2015-08-18 DIAGNOSIS — B3731 Acute candidiasis of vulva and vagina: Secondary | ICD-10-CM

## 2015-08-18 DIAGNOSIS — O9089 Other complications of the puerperium, not elsewhere classified: Secondary | ICD-10-CM | POA: Diagnosis not present

## 2015-08-18 DIAGNOSIS — B373 Candidiasis of vulva and vagina: Secondary | ICD-10-CM

## 2015-08-18 DIAGNOSIS — E079 Disorder of thyroid, unspecified: Secondary | ICD-10-CM | POA: Insufficient documentation

## 2015-08-18 LAB — URINALYSIS, ROUTINE W REFLEX MICROSCOPIC
Bilirubin Urine: NEGATIVE
GLUCOSE, UA: NEGATIVE mg/dL
KETONES UR: NEGATIVE mg/dL
NITRITE: NEGATIVE
Specific Gravity, Urine: 1.02 (ref 1.005–1.030)
pH: 6.5 (ref 5.0–8.0)

## 2015-08-18 LAB — URINE MICROSCOPIC-ADD ON

## 2015-08-18 MED ORDER — FLUCONAZOLE 150 MG PO TABS: 150.0000 mg | ORAL_TABLET | Freq: Every day | ORAL | Status: DC

## 2015-08-18 MED ORDER — TERCONAZOLE 0.4 % VA CREA: 1.0000 | TOPICAL_CREAM | Freq: Every day | VAGINAL | Status: DC

## 2015-08-18 NOTE — MAU Provider Note (Signed)
History     CSN: [id number]  Arrival date and time: 08/18/15 1551   Seen by provider at 1600     Chief Complaint  Patient presents with  . Vaginal Bleeding  . Dysuria   HPI Debra Carr 35 y.o.  Client is postpartum from C/S on 08-06-15.  Comes to MAU as she had burning with urination that started on Monday and is not resolving.  States the burning is worsening.Had UA in clinic with urine culture - reviewed the results.  Also has an area that burns on her C/S incision.  OB History    Gravida Para Term Preterm AB TAB SAB Ectopic Multiple Living   3 1 1  2  1 1  0 1      Past Medical History  Diagnosis Date  . Hypertension   . Ectopic pregnancy   . Thyroid disorder     Past Surgical History  Procedure Laterality Date  . Laparoscopy    . Cesarean section N/A 08/06/2015    Procedure: CESAREAN SECTION;  Surgeon: Woodroe Mode, MD;  Location: [facility];  Service: Obstetrics;  Laterality: N/A;    Family History  Problem Relation Age of Onset  . Hypertension Mother     Social History  Substance Use Topics  . Smoking status: Never Smoker   . Smokeless tobacco: Never Used  . Alcohol Use: No    Allergies: No Known Allergies  Prescriptions prior to admission  Medication Sig Dispense Refill Last Dose  . enalapril (VASOTEC) 10 MG tablet Take 1 tablet (10 mg total) by mouth daily. 30 tablet 1 08/18/2015 at Unknown time  . ferrous sulfate 325 (65 FE) MG tablet Take 1 tablet (325 mg total) by mouth daily with breakfast. 30 tablet 3 08/17/2015 at Unknown time  . hydrochlorothiazide (HYDRODIURIL) 25 MG tablet Take 1 tablet (25 mg total) by mouth daily. 30 tablet 1 08/17/2015 at Unknown time  . Prenatal Multivit-Min-Fe-FA (PRENATAL VITAMINS) 0.8 MG tablet Take 1 tablet by mouth daily. (Patient taking differently: Take 1 tablet by mouth daily. ) 30 tablet 6 08/17/2015 at Unknown time  . acetaminophen (TYLENOL) 325 MG tablet Take 2 tablets (650 mg total) by mouth every 4  (four) hours as needed (for pain scale < 4). (Patient not taking: Reported on 08/18/2015) 30 tablet 0 Not Taking at Unknown time  . oxyCODONE-acetaminophen (PERCOCET/ROXICET) 5-325 MG tablet Take 1-2 tablets by mouth every 4 (four) hours as needed (pain scale > 7). (Patient not taking: Reported on 08/18/2015) 40 tablet 0 Not Taking at Unknown time    Review of Systems  Constitutional: Negative for fever.  Gastrointestinal: Negative for nausea, vomiting and abdominal pain.  Genitourinary:       No vaginal discharge. Vaginal bleeding. Dysuria.    Physical Exam   Blood pressure 143/91, pulse 90, temperature 98.5 F (36.9 C), temperature source Oral, resp. rate 16, unknown if currently breastfeeding.  Physical Exam  Nursing note and vitals reviewed. Constitutional: She is oriented to person, place, and time. She appears well-developed and well-nourished.  HENT:  Head: Normocephalic.  Eyes: EOM are normal.  Neck: Neck supple.  GI: Soft. There is no tenderness.  C/S incision well healed.  Has a 3 mm X 73mm area on the right side where the external skin was not completely approximated and the area is hypopigmented.  Is mildly tender to the touch.  Is not draining and is not granulated.  Genitourinary:  Vulvar inspection.  Think pink vaginal discharge noted  on vulva - lochia.  Using one gloved finger in the vagina, client confirms she is having vaginal buring vs urethral burning.  No think white discharge seen.  Musculoskeletal: Normal range of motion.  Neurological: She is alert and oriented to person, place, and time.  Skin: Skin is warm and dry.  Psychiatric: She has a normal mood and affect.    MAU Course  Procedures Results for orders placed or performed during the hospital encounter of 08/18/15 (from the past 24 hour(s))  Urinalysis, Routine w reflex microscopic (not at [facility])     Status: Abnormal   Collection Time: 08/18/15  3:51 PM  Result Value Ref Range   Color, Urine YELLOW  YELLOW   APPearance HAZY (A) CLEAR   Specific Gravity, Urine 1.020 1.005 - 1.030   pH 6.5 5.0 - 8.0   Glucose, UA NEGATIVE NEGATIVE mg/dL   Hgb urine dipstick LARGE (A) NEGATIVE   Bilirubin Urine NEGATIVE NEGATIVE   Ketones, ur NEGATIVE NEGATIVE mg/dL   Protein, ur >300 (A) NEGATIVE mg/dL   Nitrite NEGATIVE NEGATIVE   Leukocytes, UA SMALL (A) NEGATIVE  Urine microscopic-add on     Status: Abnormal   Collection Time: 08/18/15  3:51 PM  Result Value Ref Range   Squamous Epithelial / LPF 0-5 (A) NONE SEEN   WBC, UA TOO NUMEROUS TO COUNT 0 - 5 WBC/hpf   RBC / HPF 6-30 0 - 5 RBC/hpf   Bacteria, UA MANY (A) NONE SEEN   MDM Urine culture pending.  Previous culture earlier this week did not know any infection.  Client using commercial perineal wipes (hypoallergenic) for cleansing when she goes to the bathroom.  Assessment and Plan  Probably vaginal yeast infection or possible vulvar contact dermatitis due to cleasing wipes and Always pads she is wearing  Plan Get diflucan and terazol vaginal cream to use for vaginal burning.  Recommended using only a cloth with water to cleanse the area using a patting motion instead of a wiping motion.   Advised to try another brand other than Always pads. Do not think this is a UTI but will repeat the urine culture so as not to miss a UTI. Advised 8 glasses of water daily. Expect the burning on the C/S incision to resolve in a few weeks when the scarring is completed.  Ioane Bhola 08/18/2015, 4:53 PM

## 2015-08-18 NOTE — Discharge Instructions (Signed)
Pick up your medications at the pharmacy. Expect this to feel better in approximately 1 week.

## 2015-08-18 NOTE — MAU Note (Signed)
Pt has been having burning and frequent urination since Monday, went to the doctor and said she tested negative for a UTI.  Still having symptoms.  Passing small occasional blood clots.  Pt reports being pretty active at home.

## 2015-08-20 LAB — URINE CULTURE
CULTURE: NO GROWTH
SPECIAL REQUESTS: NORMAL

## 2015-10-07 ENCOUNTER — Other Ambulatory Visit: Payer: Self-pay | Admitting: Student

## 2015-10-09 ENCOUNTER — Telehealth: Payer: Self-pay | Admitting: *Deleted

## 2015-10-09 DIAGNOSIS — I10 Essential (primary) hypertension: Secondary | ICD-10-CM

## 2015-10-09 MED ORDER — HYDROCHLOROTHIAZIDE 25 MG PO TABS: 25.0000 mg | ORAL_TABLET | Freq: Every day | ORAL | 0 refills | Status: DC

## 2015-10-09 NOTE — Telephone Encounter (Signed)
Jadelynn called yesterday and left a message she is out of bp meds and [facility] has sent a refill request but not heard. States she is out of it for 2 days.   I called [facility] and she is out of hctz . I notified her  refill approved by Dr. Ernestina Patches and  Sent to her pharmacy.

## 2015-10-14 ENCOUNTER — Ambulatory Visit (INDEPENDENT_AMBULATORY_CARE_PROVIDER_SITE_OTHER): Payer: Medicaid Other | Admitting: Obstetrics and Gynecology

## 2015-10-14 ENCOUNTER — Encounter: Payer: Self-pay | Admitting: Obstetrics and Gynecology

## 2015-10-14 DIAGNOSIS — I10 Essential (primary) hypertension: Secondary | ICD-10-CM

## 2015-10-14 DIAGNOSIS — O0993 Supervision of high risk pregnancy, unspecified, third trimester: Secondary | ICD-10-CM

## 2015-10-14 MED ORDER — HYDROCHLOROTHIAZIDE 25 MG PO TABS: 25.0000 mg | ORAL_TABLET | Freq: Every day | ORAL | 3 refills | Status: DC

## 2015-10-14 MED ORDER — ENALAPRIL MALEATE 10 MG PO TABS: 10.0000 mg | ORAL_TABLET | Freq: Every day | ORAL | 3 refills | Status: DC

## 2015-10-14 NOTE — Patient Instructions (Signed)
Hypertension Hypertension, commonly called high blood pressure, is when the force of blood pumping through your arteries is too strong. Your arteries are the blood vessels that carry blood from your heart throughout your body. A blood pressure reading consists of a higher number over a lower number, such as 110/72. The higher number (systolic) is the pressure inside your arteries when your heart pumps. The lower number (diastolic) is the pressure inside your arteries when your heart relaxes. Ideally you want your blood pressure below 120/80. Hypertension forces your heart to work harder to pump blood. Your arteries may become narrow or stiff. Having untreated or uncontrolled hypertension can cause heart attack, stroke, kidney disease, and other problems. RISK FACTORS Some risk factors for high blood pressure are controllable. Others are not.  Risk factors you cannot control include:   Race. You may be at higher risk if you are African American.  Age. Risk increases with age.  Gender. Men are at higher risk than women before age 45 years. After age 65, women are at higher risk than men. Risk factors you can control include:  Not getting enough exercise or physical activity.  Being overweight.  Getting too much fat, sugar, calories, or salt in your diet.  Drinking too much alcohol. SIGNS AND SYMPTOMS Hypertension does not usually cause signs or symptoms. Extremely high blood pressure (hypertensive crisis) may cause headache, anxiety, shortness of breath, and nosebleed. DIAGNOSIS To check if you have hypertension, your health care provider will measure your blood pressure while you are seated, with your arm held at the level of your heart. It should be measured at least twice using the same arm. Certain conditions can cause a difference in blood pressure between your right and left arms. A blood pressure reading that is higher than normal on one occasion does not mean that you need treatment. If  it is not clear whether you have high blood pressure, you may be asked to return on a different day to have your blood pressure checked again. Or, you may be asked to monitor your blood pressure at home for 1 or more weeks. TREATMENT Treating high blood pressure includes making lifestyle changes and possibly taking medicine. Living a healthy lifestyle can help lower high blood pressure. You may need to change some of your habits. Lifestyle changes may include:  Following the DASH diet. This diet is high in fruits, vegetables, and whole grains. It is low in salt, red meat, and added sugars.  Keep your sodium intake below 2,300 mg per day.  Getting at least 30-45 minutes of aerobic exercise at least 4 times per week.  Losing weight if necessary.  Not smoking.  Limiting alcoholic beverages.  Learning ways to reduce stress. Your health care provider may prescribe medicine if lifestyle changes are not enough to get your blood pressure under control, and if one of the following is true:  You are 18-59 years of age and your systolic blood pressure is above 140.  You are 60 years of age or older, and your systolic blood pressure is above 150.  Your diastolic blood pressure is above 90.  You have diabetes, and your systolic blood pressure is over 140 or your diastolic blood pressure is over 90.  You have kidney disease and your blood pressure is above 140/90.  You have heart disease and your blood pressure is above 140/90. Your personal target blood pressure may vary depending on your medical conditions, your age, and other factors. HOME CARE INSTRUCTIONS    Have your blood pressure rechecked as directed by your health care provider.   Take medicines only as directed by your health care provider. Follow the directions carefully. Blood pressure medicines must be taken as prescribed. The medicine does not work as well when you skip doses. Skipping doses also puts you at risk for  problems.  Do not smoke.   Monitor your blood pressure at home as directed by your health care provider. SEEK MEDICAL CARE IF:   You think you are having a reaction to medicines taken.  You have recurrent headaches or feel dizzy.  You have swelling in your ankles.  You have trouble with your vision. SEEK IMMEDIATE MEDICAL CARE IF:  You develop a severe headache or confusion.  You have unusual weakness, numbness, or feel faint.  You have severe chest or abdominal pain.  You vomit repeatedly.  You have trouble breathing. MAKE SURE YOU:   Understand these instructions.  Will watch your condition.  Will get help right away if you are not doing well or get worse.   This information is not intended to replace advice given to you by your health care provider. Make sure you discuss any questions you have with your health care provider.   Document Released: 02/09/2005 Document Revised: 06/26/2014 Document Reviewed: 12/02/2012 Elsevier Interactive Patient Education 2016 [employer]  

## 2015-10-14 NOTE — Progress Notes (Signed)
Ran out of enlapril last week.  Subjective:     Debra Carr is a 35 y.o. female who presents for a postpartum visit. She is 9 weeks postpartum following a low cervical transverse Cesarean section. I have fully reviewed the prenatal and intrapartum course. The delivery was at 55 gestational weeks. Outcome: primary cesarean section, low transverse incision. Anesthesia: epidural. Postpartum course has been uneventful. Baby's course has been uneventful. Baby is feeding by bottle - .. Bleeding no bleeding. Bowel function is normal. Bladder function is normal. Patient is sexually active. Contraception method is tubal ligation. Postpartum depression screening: negative.  The following portions of the patient's history were reviewed and updated as appropriate: past family history, past medical history, past social history, past surgical history and problem list.  Review of Systems Review of Systems  Constitutional: Negative for chills and fever.  HENT: Negative for congestion.   Eyes: Negative for blurred vision and double vision.  Respiratory: Negative for cough and hemoptysis.   Cardiovascular: Negative for chest pain and palpitations.  Gastrointestinal: Negative for heartburn and nausea.  Genitourinary: Negative for dysuria, frequency and urgency.  Musculoskeletal: Negative for back pain and myalgias.  Skin: Negative for itching and rash.  Neurological: Negative for dizziness and headaches.  Endo/Heme/Allergies: Negative for environmental allergies. Does not bruise/bleed easily.  Psychiatric/Behavioral: Negative for depression and suicidal ideas.     Objective:    There were no vitals taken for this visit.  General:  alert, cooperative and appears stated age   Breasts:  negative  Lungs: clear to auscultation bilaterally  Heart:  regular rate and rhythm, S1, S2 normal, no murmur, click, rub or gallop  Abdomen: soft, non-tender; bowel sounds normal; no masses,  no organomegaly   Vulva:   not evaluated  Vagina: not evaluated  Cervix:  not examined  Corpus: not examined  Adnexa:  not evaluated  Rectal Exam: Not performed.        Assessment:     35 y/o F presents for post psrtum exam postpartum exam. Pap smear not done at today's visit.   Plan:    1. Contraception: tubal ligationpreformed at time of c-section 2. Chronic Hyptertension. BP elevated today, but pt out of enapril. Refilled medications for 3 months and advised establishing with primary doctor for Bp management. 3. Follow up in: 1 year with pt primary OB.

## 2015-10-16 ENCOUNTER — Telehealth: Payer: Self-pay | Admitting: *Deleted

## 2015-10-16 MED ORDER — ENALAPRIL MALEATE 10 MG PO TABS: 10.0000 mg | ORAL_TABLET | Freq: Every day | ORAL | 3 refills | Status: DC

## 2015-10-16 NOTE — Telephone Encounter (Signed)
Pt left message yesterday stating that she was seen in the office and given refill of her BP medicine however the prescribing doctor is not approved by Medicaid. I sent Rx per prescriber Dr. Kennon Rounds. I called pt and apologized for her inconvenience and advised that her Rx has been sent to her pharmacy.  She may pick it up today. Pt voiced understanding.

## 2016-01-08 ENCOUNTER — Encounter (HOSPITAL_COMMUNITY): Payer: Self-pay

## 2017-05-03 ENCOUNTER — Encounter: Payer: Self-pay | Admitting: *Deleted

## 2017-05-24 ENCOUNTER — Emergency Department (HOSPITAL_BASED_OUTPATIENT_CLINIC_OR_DEPARTMENT_OTHER): Payer: BLUE CROSS/BLUE SHIELD

## 2017-05-24 ENCOUNTER — Other Ambulatory Visit: Payer: Self-pay

## 2017-05-24 ENCOUNTER — Encounter (HOSPITAL_BASED_OUTPATIENT_CLINIC_OR_DEPARTMENT_OTHER): Payer: Self-pay | Admitting: Emergency Medicine

## 2017-05-24 ENCOUNTER — Encounter: Payer: Self-pay | Admitting: *Deleted

## 2017-05-24 ENCOUNTER — Emergency Department (HOSPITAL_BASED_OUTPATIENT_CLINIC_OR_DEPARTMENT_OTHER)
Admission: EM | Admit: 2017-05-24 | Discharge: 2017-05-24 | Disposition: A | Discharge status: Home / Self Care | Payer: BLUE CROSS/BLUE SHIELD | Attending: Emergency Medicine | Admitting: Emergency Medicine

## 2017-05-24 DIAGNOSIS — R112 Nausea with vomiting, unspecified: Secondary | ICD-10-CM | POA: Diagnosis not present

## 2017-05-24 DIAGNOSIS — I10 Essential (primary) hypertension: Secondary | ICD-10-CM | POA: Insufficient documentation

## 2017-05-24 DIAGNOSIS — R197 Diarrhea, unspecified: Secondary | ICD-10-CM | POA: Insufficient documentation

## 2017-05-24 DIAGNOSIS — E039 Hypothyroidism, unspecified: Secondary | ICD-10-CM | POA: Insufficient documentation

## 2017-05-24 DIAGNOSIS — I159 Secondary hypertension, unspecified: Secondary | ICD-10-CM | POA: Insufficient documentation

## 2017-05-24 DIAGNOSIS — R69 Illness, unspecified: Secondary | ICD-10-CM

## 2017-05-24 DIAGNOSIS — J111 Influenza due to unidentified influenza virus with other respiratory manifestations: Secondary | ICD-10-CM | POA: Diagnosis not present

## 2017-05-24 DIAGNOSIS — M7918 Myalgia, other site: Secondary | ICD-10-CM | POA: Diagnosis present

## 2017-05-24 DIAGNOSIS — Z79899 Other long term (current) drug therapy: Secondary | ICD-10-CM | POA: Diagnosis not present

## 2017-05-24 LAB — URINALYSIS, ROUTINE W REFLEX MICROSCOPIC
BILIRUBIN URINE: NEGATIVE
Glucose, UA: NEGATIVE mg/dL
Ketones, ur: NEGATIVE mg/dL
Nitrite: NEGATIVE
PH: 6.5 (ref 5.0–8.0)
Protein, ur: 30 mg/dL — AB
SPECIFIC GRAVITY, URINE: 1.015 (ref 1.005–1.030)

## 2017-05-24 LAB — PREGNANCY, URINE: Preg Test, Ur: NEGATIVE

## 2017-05-24 LAB — URINALYSIS, MICROSCOPIC (REFLEX)

## 2017-05-24 LAB — INFLUENZA PANEL BY PCR (TYPE A & B)
Influenza A By PCR: POSITIVE — AB
Influenza B By PCR: NEGATIVE

## 2017-05-24 MED ORDER — ENALAPRIL MALEATE 10 MG PO TABS: 10.0000 mg | ORAL_TABLET | Freq: Every day | ORAL | 3 refills | Status: DC

## 2017-05-24 MED ORDER — HYDROCHLOROTHIAZIDE 25 MG PO TABS: 25.0000 mg | ORAL_TABLET | Freq: Every day | ORAL | 3 refills | Status: DC

## 2017-05-24 MED ORDER — BENZONATATE 100 MG PO CAPS: 100.0000 mg | ORAL_CAPSULE | Freq: Three times a day (TID) | ORAL | 0 refills | Status: DC

## 2017-05-24 MED ORDER — BENZONATATE 100 MG PO CAPS
100.0000 mg | ORAL_CAPSULE | Freq: Once | ORAL | Status: AC
  Administered 2017-05-24: 100 mg via ORAL
  Filled 2017-05-24: qty 1

## 2017-05-24 MED ORDER — ACETAMINOPHEN 325 MG PO TABS
650.0000 mg | ORAL_TABLET | Freq: Once | ORAL | Status: AC | PRN
  Administered 2017-05-24: 650 mg via ORAL
  Filled 2017-05-24: qty 2

## 2017-05-24 NOTE — Discharge Instructions (Signed)
Drink plenty of fluids.  Rest.  Tylenol or Motrin for fever and body aches.  Tessalon for cough.  Please restart your blood pressure medications, your blood pressure was high today.  Follow-up with your family doctor in 2-3 days.  Return if worsening symptoms

## 2017-05-24 NOTE — ED Triage Notes (Signed)
Patient states that she started to have a cough and headache with generalized body aches starting yesterday.

## 2017-05-24 NOTE — ED Provider Notes (Signed)
[facility] Provider Note   CSN: [id number] Arrival date & time: 05/24/17  1548     History   Chief Complaint Chief Complaint  Patient presents with  . Generalized Body Aches    HPI Debra Carr is a 37 y.o. female.  HPI Debra Carr is a 37 y.o. female with history of hypertension, presents to emergency department with complaint of cough, congestion, fever, body aches. Pt states her symptoms began last night. Reports fever up to 101. Took over the counter "cough medicine for high blood pressure" which did not help. States has 1yo child at home and worried she may have something contagious. Reports nausea, vomiting, diarrhea that started yesterday and improved today. No abdominal pain. Denies sore throat. No sick contacts. Also has been out of her BP meds for "months"   Past Medical History:  Diagnosis Date  . Ectopic pregnancy   . Hypertension   . Thyroid disorder     Patient Active Problem List   Diagnosis Date Noted  . Status post primary low transverse cesarean section 08/08/2015  . S/P primary low transverse C-section 08/06/2015  . Encounter for sterilization 08/06/2015  . Chronic hypertension in pregnancy 08/05/2015  . Chronic benign essential hypertension, antepartum 03/07/2015  . Maternal obesity, antepartum 03/07/2015    Past Surgical History:  Procedure Laterality Date  . CESAREAN SECTION N/A 08/06/2015   Procedure: CESAREAN SECTION;  Surgeon: Woodroe Mode, MD;  Location: [facility];  Service: Obstetrics;  Laterality: N/A;  . LAPAROSCOPY       OB History    Gravida  3   Para  1   Term  1   Preterm      AB  2   Living  1     SAB  1   TAB      Ectopic  1   Multiple  0   Live Births  1            Home Medications    Prior to Admission medications   Medication Sig Start Date End Date Taking? Authorizing Provider  enalapril (VASOTEC) 10 MG tablet Take 1 tablet (10 mg total) by mouth  daily. 10/16/15   Donnamae Jude, MD  ferrous sulfate 325 (65 FE) MG tablet Take 1 tablet (325 mg total) by mouth daily with breakfast. 04/18/15   Kathrine Haddock N, CNM  hydrochlorothiazide (HYDRODIURIL) 25 MG tablet Take 1 tablet (25 mg total) by mouth daily. 10/14/15   Waldemar Dickens, MD  Prenatal Multivit-Min-Fe-FA (PRENATAL VITAMINS) 0.8 MG tablet Take 1 tablet by mouth daily. Patient taking differently: Take 1 tablet by mouth daily.  07/04/15   Truett Mainland, DO    Family History Family History  Problem Relation Age of Onset  . Hypertension Mother     Social History Social History   Tobacco Use  . Smoking status: Never Smoker  . Smokeless tobacco: Never Used  Substance Use Topics  . Alcohol use: No  . Drug use: No     Allergies   Patient has no known allergies.   Review of Systems Review of Systems  Constitutional: Positive for chills and fever.  HENT: Positive for congestion. Negative for sore throat.   Respiratory: Positive for cough. Negative for chest tightness and shortness of breath.   Cardiovascular: Negative for chest pain, palpitations and leg swelling.  Gastrointestinal: Positive for diarrhea, nausea and vomiting. Negative for abdominal pain.  Genitourinary: Negative for dysuria, flank pain, pelvic  pain, vaginal bleeding, vaginal discharge and vaginal pain.  Musculoskeletal: Positive for arthralgias and myalgias. Negative for neck pain and neck stiffness.  Skin: Negative for rash.  Neurological: Positive for headaches. Negative for dizziness and weakness.  All other systems reviewed and are negative.    Physical Exam Updated Vital Signs BP (!) 174/107   Pulse 88   Temp 99.8 F (37.7 C) (Oral)   Resp 18   Ht 5\' 2"  (1.575 m)   Wt 108.9 kg (240 lb)   LMP 05/22/2017   SpO2 98%   BMI 43.90 kg/m   Physical Exam  Constitutional: She appears well-developed and well-nourished. No distress.  HENT:  Head: Normocephalic.  Eyes: Conjunctivae  are normal.  Neck: Neck supple.  Cardiovascular: Normal rate, regular rhythm and normal heart sounds.  Pulmonary/Chest: Effort normal and breath sounds normal. No stridor. No respiratory distress. She has no wheezes. She has no rales.  Abdominal: Soft. Bowel sounds are normal. She exhibits no distension. There is no tenderness. There is no rebound.  Musculoskeletal: She exhibits no edema.  Neurological: She is alert.  Skin: Skin is warm and dry.  Psychiatric: She has a normal mood and affect. Her behavior is normal.  Nursing note and vitals reviewed.    ED Treatments / Results  Labs (all labs ordered are listed, but only abnormal results are displayed) Labs Reviewed  URINALYSIS, ROUTINE W REFLEX MICROSCOPIC - Abnormal; Notable for the following components:      Result Value   Color, Urine BROWN (*)    APPearance HAZY (*)    Hgb urine dipstick LARGE (*)    Protein, ur 30 (*)    Leukocytes, UA TRACE (*)    All other components within normal limits  URINALYSIS, MICROSCOPIC (REFLEX) - Abnormal; Notable for the following components:   Bacteria, UA FEW (*)    Squamous Epithelial / LPF 6-30 (*)    All other components within normal limits  PREGNANCY, URINE  INFLUENZA PANEL BY PCR (TYPE A & B)    EKG None  Radiology Dg Chest 2 View  Result Date: 05/24/2017 CLINICAL DATA:  Fever and cough EXAM: CHEST - 2 VIEW COMPARISON:  None. FINDINGS: The heart is mildly enlarged. Clear lungs. Normal vascularity. No pneumothorax or pleural effusion. IMPRESSION: Cardiomegaly without decompensation. Electronically Signed   By: Marybelle Killings M.D.   On: 05/24/2017 16:18    Procedures Procedures (including critical care time)  Medications Ordered in ED Medications  acetaminophen (TYLENOL) tablet 650 mg (650 mg Oral Given 05/24/17 1608)  benzonatate (TESSALON) capsule 100 mg (100 mg Oral Given 05/24/17 1659)     Initial Impression / Assessment and Plan / ED Course  I have reviewed the triage vital  signs and the nursing notes.  Pertinent labs & imaging results that were available during my care of the patient were reviewed by me and considered in my medical decision making (see chart for details).     Pt with flu like symptoms. Will send influenza test givne she has a 39yo at home. CXR negative. UA pending. Tessalon ordered for cough. Pt is hypertensive, will restart her regular meds at home.   6:07 PM Urinalysis showing hematuria, also contaminated, patient is currently on her menstrual cycle.  No signs of infection.  She is not pregnant.  Discussed starting Tamiflu, but patient declined.  We will treat symptomatically with Tessalon, Tylenol Motrin, oral hydration.  Advised her to follow-up with family doctor as needed.  We will also restart  her blood pressure medications.  Vitals:   05/24/17 1600 05/24/17 1602 05/24/17 1653  BP:  (!) 190/128 (!) 174/107  Pulse:  89 88  Resp:  18 18  Temp:  (!) 101.3 F (38.5 C) 99.8 F (37.7 C)  TempSrc:   Oral  SpO2:  95% 98%  Weight: 108.9 kg (240 lb)    Height: 5\' 2"  (1.575 m)       Final Clinical Impressions(s) / ED Diagnoses   Final diagnoses:  Influenza-like illness  Secondary hypertension    ED Discharge Orders        Ordered    enalapril (VASOTEC) 10 MG tablet  Daily    Note to Pharmacy:  Please discontinue the previous Rx for 5 mg   05/24/17 1810    hydrochlorothiazide (HYDRODIURIL) 25 MG tablet  Daily     05/24/17 1810    benzonatate (TESSALON) 100 MG capsule  Every 8 hours     05/24/17 1810       Jeannett Senior, PA-C 05/24/17 1811    Margette Fast, MD 05/25/17 1629

## 2017-10-19 IMAGING — US US MFM FETAL BPP W/O NON-STRESS
1 series · 14 of 28 positions shown · non-contrast
Comparison: none

[Series 1: us mfm fetal bpp w/o non-stress · 38 acquisitions, 14 frames shown]
[im 2/38]
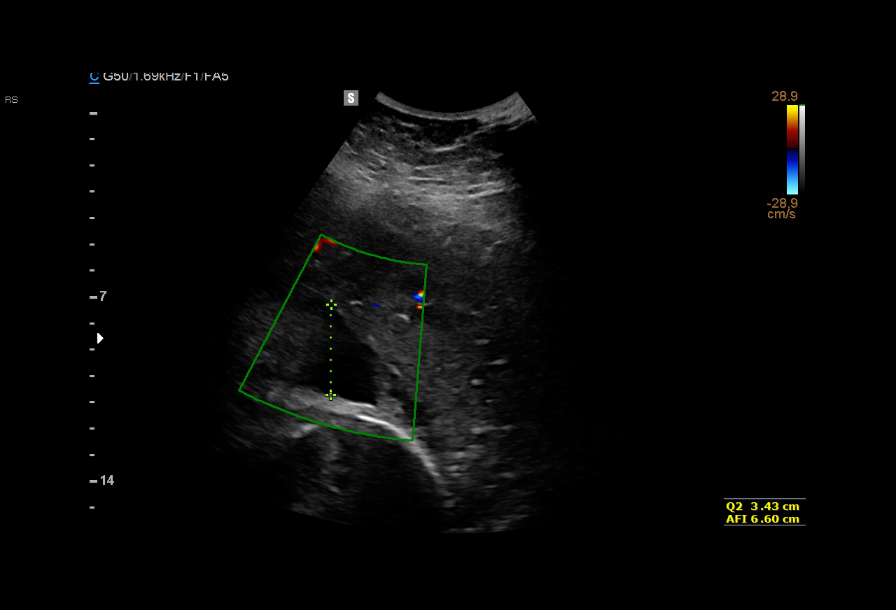
[im 5/38]
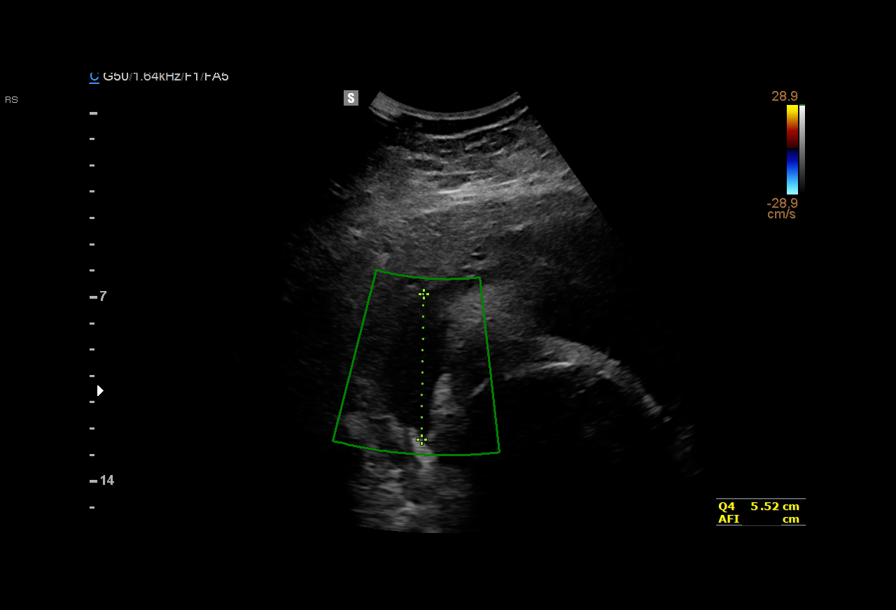
[im 7/38]
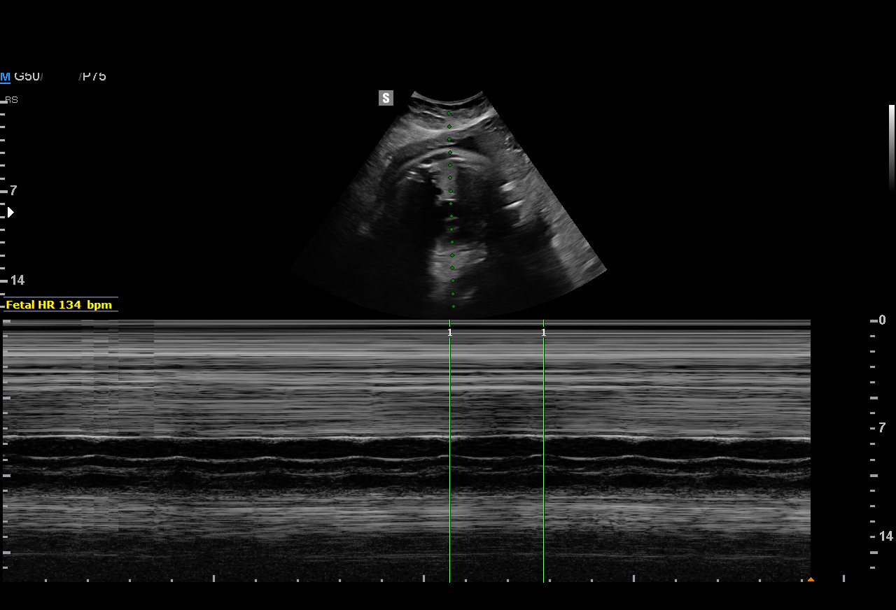
[im 10/38]
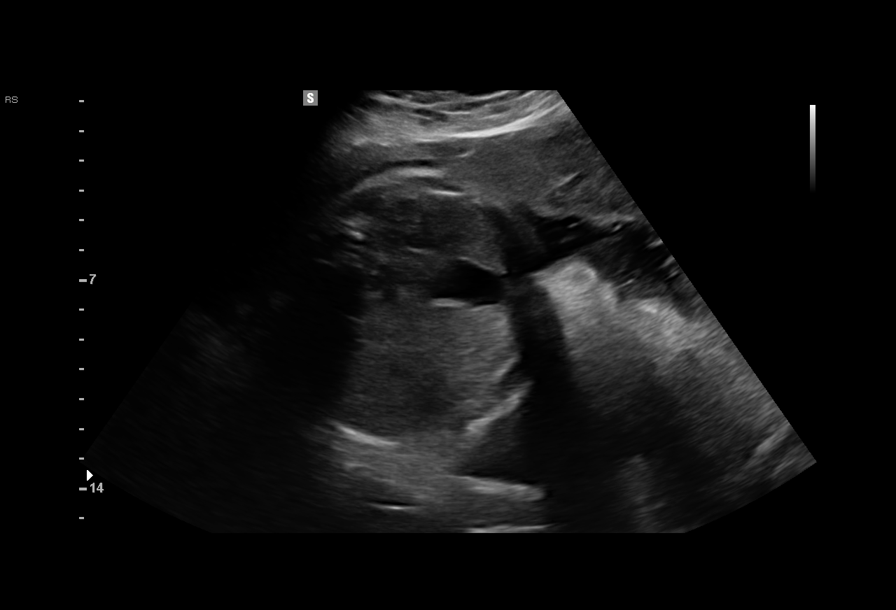
[im 13/38]
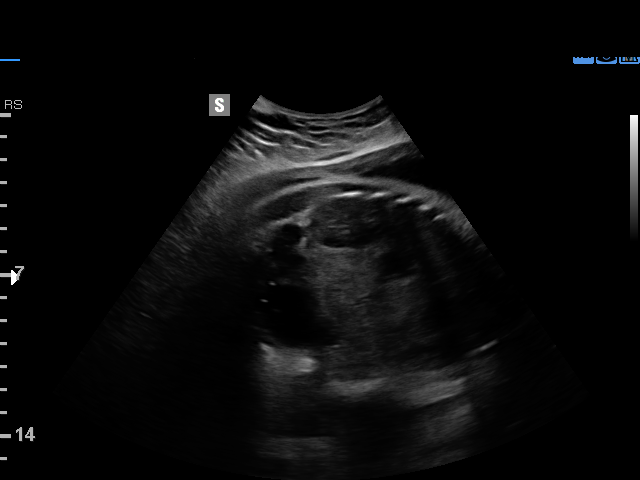
[im 16/38]
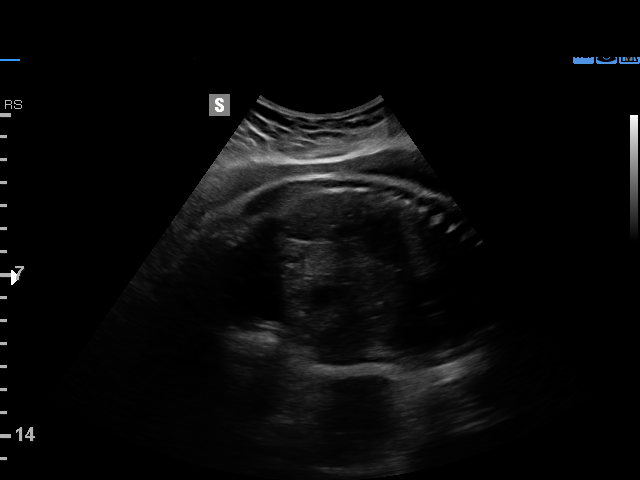
[im 18/38]
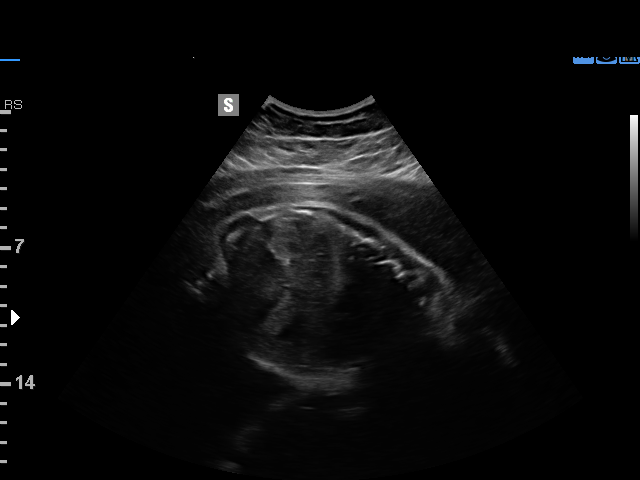
[im 21/38]
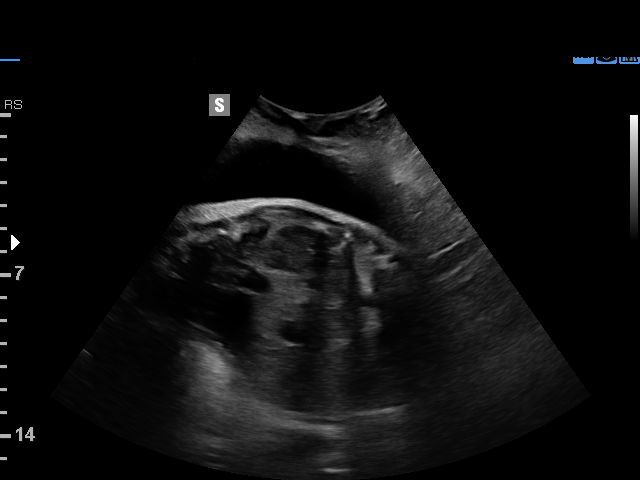
[im 24/38]
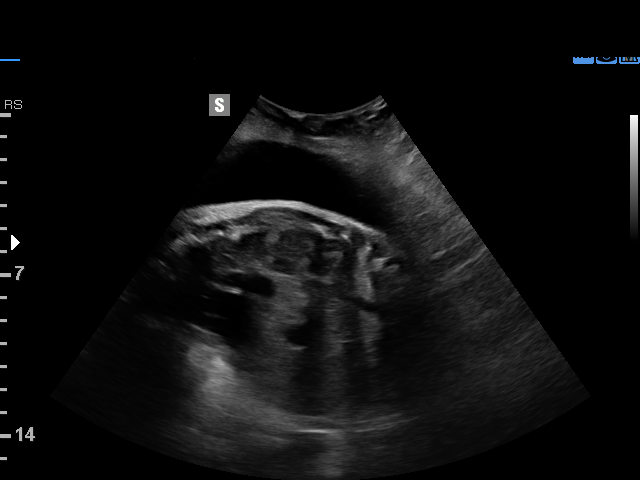
[im 27/38]
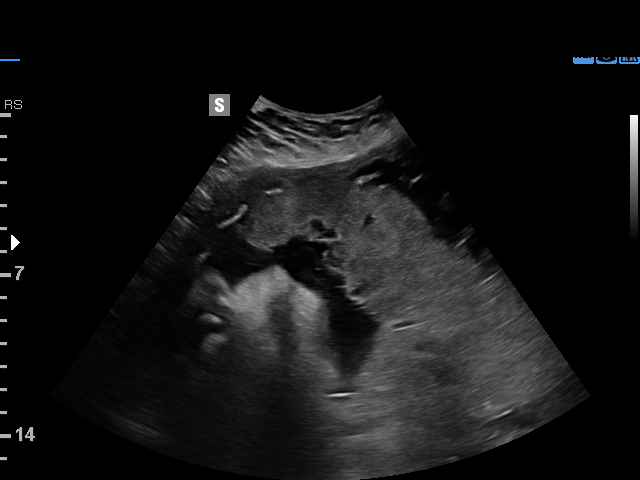
[im 29/38]
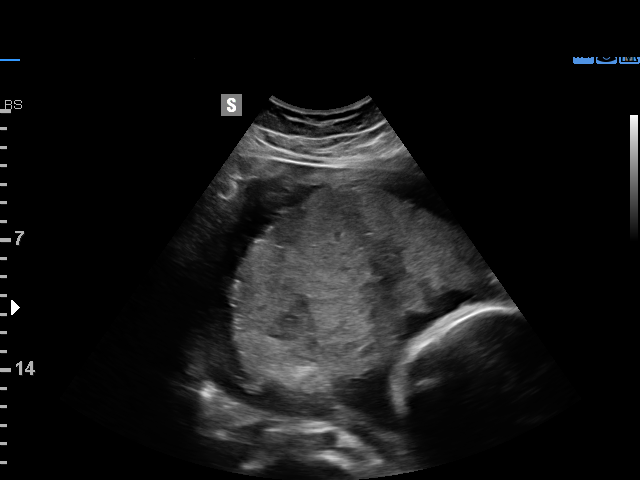
[im 32/38]
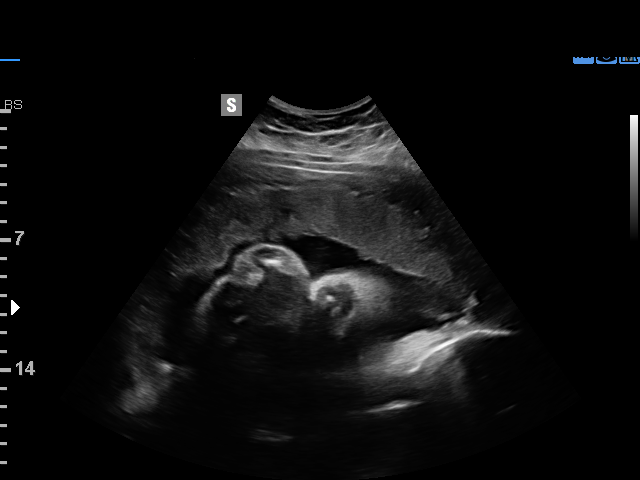
[im 35/38]
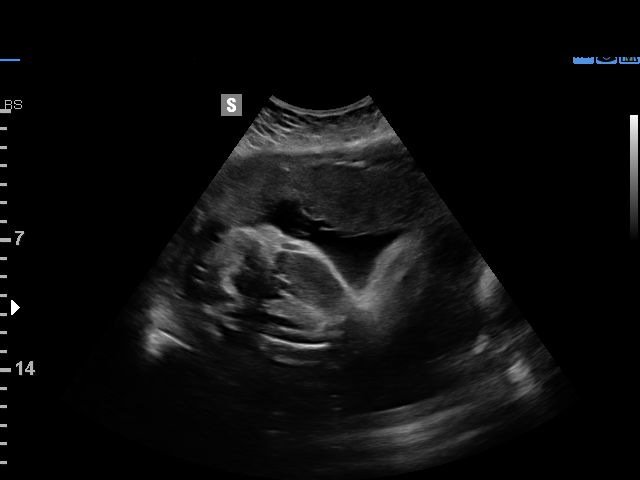
[im 38/38]
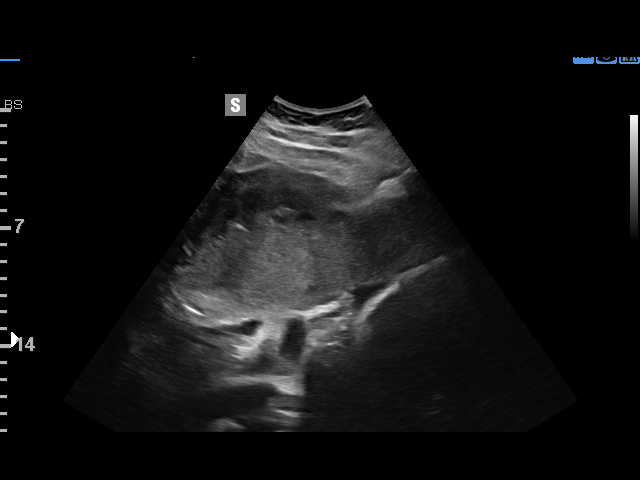

[14 of 28 positions shown; findings below may reference images not displayed]

[facility] Clinic
[REDACTED]-
Faculty Physician

1  IRVIN BILLIOT           [phone]      [phone]     [id number]
Indications

33 weeks gestation of pregnancy
Hypertension - Chronic/Pre-existing on
labetalol and ASA
Advanced maternal age multigravida 35+,
third trimester , low risk NIPS
Obesity complicating pregnancy, third
trimester
OB History

Blood Type:            Height:  5'2"   Weight (lb):  247       BMI:
Gravidity:    3         Term:   0        Prem:   0         SAB:   1
TOP:          0       Ectopic:  1        Living: 0
Fetal Evaluation

Num Of Fetuses:     1
Fetal Heart         134
Rate(bpm):
Cardiac Activity:   Observed
Presentation:       Cephalic

Amniotic Fluid
AFI FV:      Subjectively within normal limits

AFI Sum(cm)     %Tile       Largest Pocket(cm)
15.38           55

RUQ(cm)       RLQ(cm)       LUQ(cm)        LLQ(cm)
5.52
Biophysical Evaluation

Amniotic F.V:   Pocket => 2 cm two         F. Tone:         Observed
planes
F. Movement:    Observed                   Score:           [DATE]
F. Breathing:   Observed
Gestational Age

LMP:           33w 3d        Date:  11/05/14                 EDD:    08/12/15
Best:          33w 3d     Det. By:  LMP  (11/05/14)          EDD:    08/12/15
Impression

SIUP at 33+3 weeks
Cephalic presentation
Normal amniotic fluid volume
BPP [DATE]
Recommendations

Continue twice weekly NSTs with weekly AFIs or weekly BPPs

## 2019-03-21 ENCOUNTER — Other Ambulatory Visit: Payer: Self-pay

## 2019-03-21 ENCOUNTER — Emergency Department (HOSPITAL_BASED_OUTPATIENT_CLINIC_OR_DEPARTMENT_OTHER): Payer: BC Managed Care – PPO

## 2019-03-21 ENCOUNTER — Encounter (HOSPITAL_BASED_OUTPATIENT_CLINIC_OR_DEPARTMENT_OTHER): Payer: Self-pay

## 2019-03-21 ENCOUNTER — Emergency Department (HOSPITAL_BASED_OUTPATIENT_CLINIC_OR_DEPARTMENT_OTHER)
Admission: EM | Admit: 2019-03-21 | Discharge: 2019-03-21 | Disposition: A | Discharge status: Home / Self Care | Payer: BC Managed Care – PPO | Attending: Emergency Medicine | Admitting: Emergency Medicine

## 2019-03-21 DIAGNOSIS — Z79899 Other long term (current) drug therapy: Secondary | ICD-10-CM | POA: Diagnosis not present

## 2019-03-21 DIAGNOSIS — I10 Essential (primary) hypertension: Secondary | ICD-10-CM | POA: Diagnosis not present

## 2019-03-21 DIAGNOSIS — S6992XD Unspecified injury of left wrist, hand and finger(s), subsequent encounter: Secondary | ICD-10-CM | POA: Diagnosis present

## 2019-03-21 DIAGNOSIS — S61319D Laceration without foreign body of unspecified finger with damage to nail, subsequent encounter: Secondary | ICD-10-CM

## 2019-03-21 DIAGNOSIS — X58XXXD Exposure to other specified factors, subsequent encounter: Secondary | ICD-10-CM | POA: Diagnosis not present

## 2019-03-21 DIAGNOSIS — S61317D Laceration without foreign body of left little finger with damage to nail, subsequent encounter: Secondary | ICD-10-CM | POA: Diagnosis not present

## 2019-03-21 DIAGNOSIS — Z23 Encounter for immunization: Secondary | ICD-10-CM | POA: Diagnosis not present

## 2019-03-21 DIAGNOSIS — S6992XA Unspecified injury of left wrist, hand and finger(s), initial encounter: Secondary | ICD-10-CM

## 2019-03-21 HISTORY — DX: Unspecified osteoarthritis, unspecified site: M19.90

## 2019-03-21 MED ORDER — ACETAMINOPHEN 500 MG PO TABS
1000.0000 mg | ORAL_TABLET | Freq: Once | ORAL | Status: AC
  Administered 2019-03-21: 1000 mg via ORAL
  Filled 2019-03-21: qty 2

## 2019-03-21 MED ORDER — TETANUS-DIPHTH-ACELL PERTUSSIS 5-2.5-18.5 LF-MCG/0.5 IM SUSP
0.5000 mL | Freq: Once | INTRAMUSCULAR | Status: AC
  Administered 2019-03-21: 0.5 mL via INTRAMUSCULAR
  Filled 2019-03-21: qty 0.5

## 2019-03-21 NOTE — Discharge Instructions (Signed)
Tylenol and ibuprofen as needed for pain.  You may let warm soapy water run over the wound.  Follow-up with hand surgery outpatient.

## 2019-03-21 NOTE — ED Provider Notes (Signed)
[facility] Provider Note   CSN: [id number] Arrival date & time: 03/21/19  1633    History Finger injury  Debra Carr is a 39 y.o. female with past medical history significant for hypertension who presents for evaluation of left little finger injury.  Patient states 8 AM, today, 31 hours PTA her left pinky finger got caught on a tape dispenser at work.  Patient works for [employer] in [place].  Patient unsure of her last tetanus.  Patient was seen by Workmen's Comp. physician yesterday which patient states they just cleaned it up and told her the nailbed would have to grow out.  Patient states she has a abnormal sensation to the tip of her pinky finger.  She has no active bleeding or drainage.  No fever, chills, nausea, vomiting, decreased range of motion, swelling, redness or warmth.  Denies additional aggravating or alleviating factors.  Pain rated a 5.  Described as throbbing.  History pain from patient and past medical records.  No interpreter is used.  HPI     Past Medical History:  Diagnosis Date   Arthritis    Ectopic pregnancy    Hypertension    Thyroid disorder     Patient Active Problem List   Diagnosis Date Noted   Status post primary low transverse cesarean section 08/08/2015   S/P primary low transverse C-section 08/06/2015   Encounter for sterilization 08/06/2015   Chronic hypertension in pregnancy 08/05/2015   Chronic benign essential hypertension, antepartum 03/07/2015   Maternal obesity, antepartum 03/07/2015    Past Surgical History:  Procedure Laterality Date   CESAREAN SECTION N/A 08/06/2015   Procedure: CESAREAN SECTION;  Surgeon: Woodroe Mode, MD;  Location: [facility];  Service: Obstetrics;  Laterality: N/A;   LAPAROSCOPY     miniscus Right      OB History    Gravida  3   Para  1   Term  1   Preterm      AB  2   Living  1     SAB  1   TAB      Ectopic  1   Multiple  0     Live Births  1           Family History  Problem Relation Age of Onset   Hypertension Mother     Social History   Tobacco Use   Smoking status: Never Smoker   Smokeless tobacco: Never Used  Substance Use Topics   Alcohol use: No   Drug use: No    Home Medications Prior to Admission medications   Medication Sig Start Date End Date Taking? Authorizing Provider  amLODipine-atorvastatin (CADUET) 10-40 MG tablet Take 1 tablet by mouth daily.   Yes [provider]  atenolol-chlorthalidone (TENORETIC) 50-25 MG tablet Take 1 tablet by mouth daily.   Yes [provider]  benzonatate (TESSALON) 100 MG capsule Take 1 capsule (100 mg total) by mouth every 8 (eight) hours. 05/24/17   Kirichenko, Tatyana, PA-C  enalapril (VASOTEC) 10 MG tablet Take 1 tablet (10 mg total) by mouth daily. 05/24/17   Kirichenko, Lahoma Rocker, PA-C  ferrous sulfate 325 (65 FE) MG tablet Take 1 tablet (325 mg total) by mouth daily with breakfast. 04/18/15   Karim-Rhoades, Dara Lords, CNM  hydrochlorothiazide (HYDRODIURIL) 25 MG tablet Take 1 tablet (25 mg total) by mouth daily. 05/24/17   Kirichenko, Lahoma Rocker, PA-C  Prenatal Multivit-Min-Fe-FA (PRENATAL VITAMINS) 0.8 MG tablet Take 1 tablet by  mouth daily. Patient taking differently: Take 1 tablet by mouth daily.  07/04/15   Truett Mainland, DO    Allergies    Patient has no known allergies.  Review of Systems   Review of Systems  Constitutional: Negative.   HENT: Negative.   Respiratory: Negative.   Cardiovascular: Negative.   Gastrointestinal: Negative.   Genitourinary: Negative.   Musculoskeletal: Negative.   Skin: Positive for wound.  Neurological: Negative.   All other systems reviewed and are negative.   Physical Exam Updated Vital Signs BP 126/68 (BP Location: Right Arm)    Pulse 60    Temp 97.9 F (36.6 C) (Oral)    Resp 16    Ht 5\' 2"  (1.575 m)    Wt 124.3 kg    LMP 03/07/2019    SpO2 100%    BMI 50.12 kg/m   Physical  Exam Vitals and nursing note reviewed.  Constitutional:      General: She is not in acute distress.    Appearance: She is well-developed. She is not ill-appearing, toxic-appearing or diaphoretic.  HENT:     Head: Normocephalic and atraumatic.     Nose: Nose normal.     Mouth/Throat:     Mouth: Mucous membranes are moist.     Pharynx: Oropharynx is clear.  Eyes:     Pupils: Pupils are equal, round, and reactive to light.  Cardiovascular:     Rate and Rhythm: Normal rate.     Pulses: Normal pulses.     Heart sounds: Normal heart sounds.  Pulmonary:     Effort: Pulmonary effort is normal. No respiratory distress.     Breath sounds: Normal breath sounds.  Abdominal:     General: Bowel sounds are normal. There is no distension.     Tenderness: There is no abdominal tenderness. There is no right CVA tenderness, left CVA tenderness or guarding.  Musculoskeletal:        General: Normal range of motion.     Right upper arm: Normal.     Left upper arm: Normal.     Right elbow: Normal.     Left elbow: Normal.     Right forearm: Normal.     Left forearm: Normal.     Right wrist: Normal.     Left wrist: Normal.     Right hand: Normal.     Left hand: Tenderness present. No swelling, deformity, lacerations or bony tenderness. Normal range of motion. Normal strength. Normal sensation. There is no disruption of two-point discrimination. Normal capillary refill. Normal pulse.     Cervical back: Normal range of motion.     Comments: Moves all 4 extremities without difficulty.  Some mild tenderness palpation to distal aspect to left little finger.  No bleeding or drainage.  No extension of pain into metacarpals.  Full range of motion with flexion and extension.  Skin:    General: Skin is warm and dry.     Capillary Refill: Capillary refill takes less than 2 seconds.     Comments: Brisk capillary refill.  Nonbleeding, nondraining 3 mm skin tear to left little finger nailbed.  Overlying Dermabond  to nail bed.   Neurological:     Mental Status: She is alert.     Cranial Nerves: Cranial nerves are intact.     Sensory: Sensation is intact.     Motor: Motor function is intact.     Coordination: Coordination is intact.     Gait: Gait is intact.  Comments: Intact sensation however some very minimal decrease sensation to the distal tip overlying the old nail bed to left little finger.     ED Results / Procedures / Treatments   Labs (all labs ordered are listed, but only abnormal results are displayed) Labs Reviewed - No data to display  EKG None  Radiology DG Finger Little Left  Result Date: 03/21/2019 CLINICAL DATA:  Finger injury EXAM: LEFT LITTLE FINGER 2+V COMPARISON:  None. FINDINGS: No fracture or malalignment. No radiopaque foreign body in the soft tissues. IMPRESSION: No acute osseous abnormality Electronically Signed   By: Donavan Foil M.D.   On: 03/21/2019 17:21    Procedures Procedures (including critical care time)  Medications Ordered in ED Medications  Tdap (BOOSTRIX) injection 0.5 mL (0.5 mLs Intramuscular Given 03/21/19 1658)  acetaminophen (TYLENOL) tablet 1,000 mg (1,000 mg Oral Given 03/21/19 1709)   ED Course  I have reviewed the triage vital signs and the nursing notes.  Pertinent labs & imaging results that were available during my care of the patient were reviewed by me and considered in my medical decision making (see chart for details).  39 year old female appears otherwise well presents for evaluation of left pinky injury which occurred 31 hours PTA.  Was seen by occupational medicine and had Dermabond placed and skin clean.  Unsure last tetanus, will update.  Patient with 4 mm, nailbed contusion which has overlying Dermabond.  Full range of motion upper extremities without difficulty.  Minimal decrease sensation to distal tip overlying the old nail bed or prior acrylic nail was in place.  No active bleeding or drainage.  She is afebrile, nonseptic,  non-ill-appearing.  Wound does not look actively infected. Nail is already removed however small nail remains into the proximal nail fold. Plain films shows no underlying fracture, dislocation.  Patient to follow up with hand surgery outpatient.  No evidence of vascular, tendon or ligament injury on exam.  Given has active Dermabond over this from occupational medicine injury occurred greater than 31 hours do not feel patient requires any suturing.  RICE for symptomatic management.   The patient has been appropriately medically screened and/or stabilized in the ED. I have low suspicion for any other emergent medical condition which would require further screening, evaluation or treatment in the ED or require inpatient management.  Patient is hemodynamically stable and in no acute distress.  Patient able to ambulate in department prior to ED.  Evaluation does not show acute pathology that would require ongoing or additional emergent interventions while in the emergency department or further inpatient treatment.  I have discussed the diagnosis with the patient and answered all questions.  Pain is been managed while in the emergency department and patient has no further complaints prior to discharge.  Patient is comfortable with plan discussed in room and is stable for discharge at this time.  I have discussed strict return precautions for returning to the emergency department.  Patient was encouraged to follow-up with PCP/specialist refer to at discharge.    MDM Rules/Calculators/A&P                      Final Clinical Impression(s) / ED Diagnoses Final diagnoses:  Laceration of nail bed of finger, subsequent encounter  Injury of finger of left hand, initial encounter    Rx / DC Orders ED Discharge Orders    None       Maddelyn Rocca A, PA-C 03/21/19 1729    Plunkett, [place],  MD 03/25/19 [id number]

## 2019-03-21 NOTE — ED Triage Notes (Signed)
Yesterday approx 8am pt cute left pinky tip/nail bed on a tape dispenser, works for [employer] in [place]. No active bleeding. States no feeling to tip of pinky finger.

## 2019-06-14 ENCOUNTER — Other Ambulatory Visit: Payer: Self-pay

## 2019-06-14 ENCOUNTER — Emergency Department (HOSPITAL_BASED_OUTPATIENT_CLINIC_OR_DEPARTMENT_OTHER)
Admission: EM | Admit: 2019-06-14 | Discharge: 2019-06-14 | Disposition: A | Discharge status: Home / Self Care | Payer: BC Managed Care – PPO | Attending: Emergency Medicine | Admitting: Emergency Medicine

## 2019-06-14 ENCOUNTER — Emergency Department (HOSPITAL_BASED_OUTPATIENT_CLINIC_OR_DEPARTMENT_OTHER): Payer: BC Managed Care – PPO

## 2019-06-14 ENCOUNTER — Encounter (HOSPITAL_BASED_OUTPATIENT_CLINIC_OR_DEPARTMENT_OTHER): Payer: Self-pay

## 2019-06-14 DIAGNOSIS — E876 Hypokalemia: Secondary | ICD-10-CM | POA: Diagnosis not present

## 2019-06-14 DIAGNOSIS — Z79899 Other long term (current) drug therapy: Secondary | ICD-10-CM | POA: Insufficient documentation

## 2019-06-14 DIAGNOSIS — K529 Noninfective gastroenteritis and colitis, unspecified: Secondary | ICD-10-CM | POA: Insufficient documentation

## 2019-06-14 DIAGNOSIS — I10 Essential (primary) hypertension: Secondary | ICD-10-CM | POA: Diagnosis not present

## 2019-06-14 DIAGNOSIS — R109 Unspecified abdominal pain: Secondary | ICD-10-CM | POA: Diagnosis present

## 2019-06-14 LAB — CBC WITH DIFFERENTIAL/PLATELET
Abs Immature Granulocytes: 0.01 10*3/uL (ref 0.00–0.07)
Basophils Absolute: 0 10*3/uL (ref 0.0–0.1)
Basophils Relative: 0 %
Eosinophils Absolute: 0 10*3/uL (ref 0.0–0.5)
Eosinophils Relative: 0 %
HCT: 31.4 % — ABNORMAL LOW (ref 36.0–46.0)
Hemoglobin: 9.9 g/dL — ABNORMAL LOW (ref 12.0–15.0)
Immature Granulocytes: 0 %
Lymphocytes Relative: 20 %
Lymphs Abs: 1.1 10*3/uL (ref 0.7–4.0)
MCH: 28.5 pg (ref 26.0–34.0)
MCHC: 31.5 g/dL (ref 30.0–36.0)
MCV: 90.5 fL (ref 80.0–100.0)
Monocytes Absolute: 0.4 10*3/uL (ref 0.1–1.0)
Monocytes Relative: 7 %
Neutro Abs: 4.1 10*3/uL (ref 1.7–7.7)
Neutrophils Relative %: 73 %
Platelets: 340 10*3/uL (ref 150–400)
RBC: 3.47 MIL/uL — ABNORMAL LOW (ref 3.87–5.11)
RDW: 13.3 % (ref 11.5–15.5)
WBC: 5.7 10*3/uL (ref 4.0–10.5)
nRBC: 0 % (ref 0.0–0.2)

## 2019-06-14 LAB — COMPREHENSIVE METABOLIC PANEL
ALT: 15 U/L (ref 0–44)
AST: 25 U/L (ref 15–41)
Albumin: 3.5 g/dL (ref 3.5–5.0)
Alkaline Phosphatase: 43 U/L (ref 38–126)
Anion gap: 11 (ref 5–15)
BUN: 14 mg/dL (ref 6–20)
CO2: 26 mmol/L (ref 22–32)
Calcium: 8.8 mg/dL — ABNORMAL LOW (ref 8.9–10.3)
Chloride: 99 mmol/L (ref 98–111)
Creatinine, Ser: 0.74 mg/dL (ref 0.44–1.00)
GFR calc Af Amer: >60 mL/min (ref >60)
GFR calc non Af Amer: >60 mL/min (ref >60)
Glucose, Bld: 120 mg/dL — ABNORMAL HIGH (ref 70–99)
Potassium: 2.8 mmol/L — ABNORMAL LOW (ref 3.5–5.1)
Sodium: 136 mmol/L (ref 135–145)
Total Bilirubin: 0.7 mg/dL (ref 0.3–1.2)
Total Protein: 7.6 g/dL (ref 6.5–8.1)

## 2019-06-14 LAB — URINALYSIS, ROUTINE W REFLEX MICROSCOPIC
Bilirubin Urine: NEGATIVE
Glucose, UA: NEGATIVE mg/dL
Hgb urine dipstick: NEGATIVE
Ketones, ur: NEGATIVE mg/dL
Leukocytes,Ua: NEGATIVE
Nitrite: NEGATIVE
Protein, ur: NEGATIVE mg/dL
Specific Gravity, Urine: 1.02 (ref 1.005–1.030)
pH: 6 (ref 5.0–8.0)

## 2019-06-14 LAB — PREGNANCY, URINE: Preg Test, Ur: NEGATIVE

## 2019-06-14 LAB — LIPASE, BLOOD: Lipase: 24 U/L (ref 11–51)

## 2019-06-14 LAB — MAGNESIUM: Magnesium: 1.6 mg/dL — ABNORMAL LOW (ref 1.7–2.4)

## 2019-06-14 MED ORDER — SODIUM CHLORIDE 0.9 % IV BOLUS (SEPSIS)
1000.0000 mL | Freq: Once | INTRAVENOUS | Status: AC
  Administered 2019-06-14: 1000 mL via INTRAVENOUS

## 2019-06-14 MED ORDER — KETOROLAC TROMETHAMINE 30 MG/ML IJ SOLN
INTRAMUSCULAR | Status: AC
  Filled 2019-06-14: qty 1

## 2019-06-14 MED ORDER — POTASSIUM CHLORIDE 10 MEQ/100ML IV SOLN
10.0000 meq | INTRAVENOUS | Status: AC
  Administered 2019-06-14 (×2): 10 meq via INTRAVENOUS
  Filled 2019-06-14 (×2): qty 100

## 2019-06-14 MED ORDER — DICYCLOMINE HCL 20 MG PO TABS: 20.0000 mg | ORAL_TABLET | Freq: Three times a day (TID) | ORAL | 0 refills | Status: DC | PRN

## 2019-06-14 MED ORDER — ONDANSETRON 4 MG PO TBDP: 4.0000 mg | ORAL_TABLET | Freq: Four times a day (QID) | ORAL | 0 refills | Status: DC | PRN

## 2019-06-14 MED ORDER — MAGNESIUM SULFATE 2 GM/50ML IV SOLN
2.0000 g | Freq: Once | INTRAVENOUS | Status: AC
  Administered 2019-06-14: 2 g via INTRAVENOUS
  Filled 2019-06-14: qty 50

## 2019-06-14 MED ORDER — POTASSIUM CHLORIDE CRYS ER 20 MEQ PO TBCR: 20.0000 meq | EXTENDED_RELEASE_TABLET | Freq: Two times a day (BID) | ORAL | 0 refills | Status: DC

## 2019-06-14 MED ORDER — KETOROLAC TROMETHAMINE 30 MG/ML IJ SOLN
30.0000 mg | Freq: Once | INTRAMUSCULAR | Status: AC
  Administered 2019-06-14: 30 mg via INTRAVENOUS
  Filled 2019-06-14: qty 1

## 2019-06-14 MED ORDER — POTASSIUM CHLORIDE CRYS ER 20 MEQ PO TBCR
40.0000 meq | EXTENDED_RELEASE_TABLET | Freq: Once | ORAL | Status: AC
  Administered 2019-06-14: 40 meq via ORAL
  Filled 2019-06-14: qty 2

## 2019-06-14 NOTE — ED Notes (Signed)
ED Provider at bedside. 

## 2019-06-14 NOTE — Discharge Instructions (Addendum)
Your pain may be secondary to the beginning of a viral illness.  You may begin having nausea and vomiting as well as diarrhea.  We have discharged with a prescription for nausea medicine and you may use over-the-counter Imodium as needed for diarrhea.  You may use over-the-counter Tylenol 1000 mg every 6 hours as needed for pain, fever.  Your potassium and magnesium levels were low today.  Your potassium level was 2.8 and your magnesium level is 1.6.  We have given you replacement and are discharging with a prescription of potassium.  Please follow-up with your primary care physician to have this rechecked in 1 to 2 weeks.

## 2019-06-14 NOTE — ED Provider Notes (Signed)
TIME SEEN: 4:06 AM  CHIEF COMPLAINT: Right flank and right upper quadrant abdominal pain  HPI: Patient is a 39 year old female with history of hypertension who presents to the emergency department with sudden onset right flank and right upper quadrant abdominal pain that woke her from sleep at 1:30 AM.  Describes it as sharp in nature.  No fevers, chills, nausea, vomiting, diarrhea, dysuria, hematuria, vaginal bleeding or discharge.  Last menstrual period was April 7.  No previous abdominal surgeries.  No history of gallstones or kidney stones.  No known aggravating or relieving factors.  States she went to bed feeling well.  She did have ribs for dinner.  ROS: See HPI Constitutional: no fever  Eyes: no drainage  ENT: no runny nose   Cardiovascular:  no chest pain  Resp: no SOB  GI: no vomiting GU: no dysuria Integumentary: no rash  Allergy: no hives  Musculoskeletal: no leg swelling  Neurological: no slurred speech ROS otherwise negative  PAST MEDICAL HISTORY/PAST SURGICAL HISTORY:  Past Medical History:  Diagnosis Date  . Arthritis   . Ectopic pregnancy   . Hypertension   . Thyroid disorder     MEDICATIONS:  Prior to Admission medications   Medication Sig Start Date End Date Taking? Authorizing Provider  amLODipine-atorvastatin (CADUET) 10-40 MG tablet Take 1 tablet by mouth daily.   Yes [provider]  atenolol-chlorthalidone (TENORETIC) 50-25 MG tablet Take 1 tablet by mouth daily.   Yes [provider]  enalapril (VASOTEC) 10 MG tablet Take 1 tablet (10 mg total) by mouth daily. 05/24/17  Yes Kirichenko, Lahoma Rocker, PA-C  ferrous sulfate 325 (65 FE) MG tablet Take 1 tablet (325 mg total) by mouth daily with breakfast. 04/18/15  Yes Karim-Rhoades, Walidah N, CNM  hydrochlorothiazide (HYDRODIURIL) 25 MG tablet Take 1 tablet (25 mg total) by mouth daily. 05/24/17  Yes Kirichenko, Lahoma Rocker, PA-C  Prenatal Multivit-Min-Fe-FA (PRENATAL VITAMINS) 0.8 MG tablet Take 1  tablet by mouth daily. Patient taking differently: Take 1 tablet by mouth daily.  07/04/15  Yes Truett Mainland, DO  benzonatate (TESSALON) 100 MG capsule Take 1 capsule (100 mg total) by mouth every 8 (eight) hours. 05/24/17   Kirichenko, Lahoma Rocker, PA-C    ALLERGIES:  No Known Allergies  SOCIAL HISTORY:  Social History   Tobacco Use  . Smoking status: Never Smoker  . Smokeless tobacco: Never Used  Substance Use Topics  . Alcohol use: No    FAMILY HISTORY: Family History  Problem Relation Age of Onset  . Hypertension Mother     EXAM: BP (!) 97/57 (BP Location: Left Arm) Comment: patient states she just had a sip of water  Pulse (!) 56   Temp (!) 97.4 F (36.3 C) (Oral)   Resp 18   Ht 5\' 2"  (1.575 m)   Wt 120.1 kg   LMP 05/31/2019 (Approximate)   SpO2 100%   BMI 48.43 kg/m  CONSTITUTIONAL: Alert and oriented and responds appropriately to questions. well-nourished, afebrile, nontoxic, appears to be having some pain but not in distress HEAD: Normocephalic EYES: Conjunctivae clear, pupils appear equal, EOM appear intact ENT: normal nose; moist mucous membranes NECK: Supple, normal ROM CARD: RRR; S1 and S2 appreciated; no murmurs, no clicks, no rubs, no gallops RESP: Normal chest excursion without splinting or tachypnea; breath sounds clear and equal bilaterally; no wheezes, no rhonchi, no rales, no hypoxia or respiratory distress, speaking full sentences ABD/GI: Normal bowel sounds; non-distended; soft, tender in the right upper quadrant, right mid abdomen  and right flank.  Negative Murphy sign.  No tenderness at McBurney's point. BACK:  The back appears normal EXT: Normal ROM in all joints; no deformity noted, no edema; no cyanosis SKIN: Normal color for age and race; warm; no rash on exposed skin NEURO: Moves all extremities equally PSYCH: The patient's mood and manner are appropriate.   MEDICAL DECISION MAKING: Patient here with complaints of sudden onset flank and  abdominal pain.  Differential includes kidney stones, pyelonephritis, cholelithiasis, cholecystitis, cholangitis, pancreatitis.  Doubt appendicitis given location of pain.  Will obtain labs, urine.  Will treat with IV fluids, Toradol.  Will obtain CT of the abdomen pelvis.  ED PROGRESS: Labs reviewed/interpreted.  Patient has chronic anemia.  Hemoglobin 9.9.  Potassium level low at 2.8 and magnesium level is 1.6.  Will replace both.  EKG reviewed/interpreted.  EKG shows no interval abnormalities.  Patient has no leukocytosis.  Her renal function is normal.  Her LFTs and lipase are normal.  CT scan reviewed/interpreted.  She has stranding in the small bowel mesentery that could be due to enteritis.  She denies any nausea, vomiting, diarrhea or fever.  She reports she is feeling much better.  No kidney stones.  Gallbladder appears normal.  Will replace electrolytes and anticipate discharge home afterwards.  She is tolerating oral fluids here.  Will discharge with prescription of Zofran in case she begins having nausea and vomiting.  Recommended over-the-counter Tylenol as needed for pain, Imodium as needed for diarrhea.  Will discharge with prescription of potassium and have recommended PCP follow-up.  I do not feel she needs antibiotics or further emergent work-up at this time.  She is comfortable with this plan.   At this time, I do not feel there is any life-threatening condition present. I have reviewed, interpreted and discussed all results (EKG, imaging, lab, urine as appropriate) and exam findings with patient/family. I have reviewed nursing notes and appropriate previous records.  I feel the patient is safe to be discharged home without further emergent workup and can continue workup as an outpatient as needed. Discussed usual and customary return precautions. Patient/family verbalize understanding and are comfortable with this plan.  Outpatient follow-up has been provided as needed. All questions  have been answered.   EKG Interpretation  Date/Time:  Wednesday June 14 2019 05:20:25 EDT Ventricular Rate:  59 PR Interval:    QRS Duration: 109 QT Interval:  477 QTC Calculation: 473 R Axis:   76 Text Interpretation: Sinus rhythm Prolonged PR interval No old tracing to compare Confirmed by Honi Name, Cyril Mourning [phone]) on 06/14/2019 5:24:37 AM          Micah Noel was evaluated in Emergency Department on 06/14/2019 for the symptoms described in the history of present illness. She was evaluated in the context of the global COVID-19 pandemic, which necessitated consideration that the patient might be at risk for infection with the SARS-CoV-2 virus that causes COVID-19. Institutional protocols and algorithms that pertain to the evaluation of patients at risk for COVID-19 are in a state of rapid change based on information released by regulatory bodies including the CDC and federal and state organizations. These policies and algorithms were followed during the patient's care in the ED.      Wrigley Plasencia, Delice Bison, DO 06/14/19 [phone]

## 2019-06-14 NOTE — ED Triage Notes (Signed)
Patient c/o right flank pain with sudden onset at approximately 130am; denies any other associated sxs.

## 2020-03-06 ENCOUNTER — Ambulatory Visit: Payer: BC Managed Care – PPO | Admitting: Internal Medicine

## 2020-04-19 ENCOUNTER — Ambulatory Visit (INDEPENDENT_AMBULATORY_CARE_PROVIDER_SITE_OTHER): Payer: BC Managed Care – PPO | Admitting: Internal Medicine

## 2020-04-19 ENCOUNTER — Other Ambulatory Visit: Payer: Self-pay

## 2020-04-19 ENCOUNTER — Encounter: Payer: Self-pay | Admitting: Internal Medicine

## 2020-04-19 VITALS — BP 110/70 | HR 60 | Ht 62.0 in | Wt 276.0 lb

## 2020-04-19 DIAGNOSIS — K219 Gastro-esophageal reflux disease without esophagitis: Secondary | ICD-10-CM

## 2020-04-19 DIAGNOSIS — R1013 Epigastric pain: Secondary | ICD-10-CM | POA: Diagnosis not present

## 2020-04-19 MED ORDER — OMEPRAZOLE 40 MG PO CPDR: 40.0000 mg | DELAYED_RELEASE_CAPSULE | Freq: Every day | ORAL | 3 refills | Status: DC

## 2020-04-19 NOTE — Progress Notes (Signed)
HISTORY OF PRESENT ILLNESS:  Debra Carr is a 40 y.o. 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  Patient tells me that she has been having symptoms for approximately 5 to 6 weeks.  She describes postprandial epigastric burning as well as substernal pyrosis.  Symptoms are exacerbated by items such as [employer] and tomato sauce.  No dysphagia.  She states that her weight fluctuates.  She does have nausea but no vomiting.  GI review of symptoms is otherwise negative.  She tells me that her PCP prescribed pantoprazole in December 2021.  She discontinued this after 1 week due to abdominal cramping.  She does state that liquid antacids like Mylanta or Maalox seem to help her symptom complex.  Review of outside records from her primary provider include blood work.  Review of blood work shows normal liver tests.  CBC reveals anemia with hemoglobin 10.9.  Continues to have menstrual periods and states that her edema is chronic.  Further review of outside records shows that the patient was seen in the emergency room at [facility] 14, 2019 complaints at that time were right upper quadrant and right flank pain that awoke her from sleep.  Blood work at that time revealed hemoglobin 9.9, white blood cell count 5.7, normal liver tests, hypokalemia, negative pregnancy test.  A contrast-enhanced CT scan of the abdomen and pelvis was a renal stone study.  No IV contrast.  She was said to have nonspecific stranding of the small bowel mesentery.  Normal-appearing gallbladder.  Uterine fibroid.  Incidental cardiomegaly without heart failure.  No nephrolithiasis.  That particular problem did not recur.  No family history of gastrointestinal problems or cancers.  She has completed her COVID vaccination series  REVIEW OF SYSTEMS:  All non-GI ROS negative unless  otherwise stated in the HPI.  Past Medical History:  Diagnosis Date  . Arthritis   . Ectopic pregnancy   . GERD (gastroesophageal reflux disease)   . Hypertension   . Thyroid disorder     Past Surgical History:  Procedure Laterality Date  . CESAREAN SECTION N/A 08/06/2015   Procedure: CESAREAN SECTION;  Surgeon: Woodroe Mode, MD;  Location: [facility];  Service: Obstetrics;  Laterality: N/A;  . LAPAROSCOPY    . miniscus Right     Social History Debra Carr  reports that she has never smoked. She has never used smokeless tobacco. She reports that she does not drink alcohol and does not use drugs.  family history includes Hypertension in her mother.  No Known Allergies     PHYSICAL EXAMINATION: Vital signs: BP 110/70   Pulse 60   Ht 5' 2"  (1.575 m)   Wt 276 lb (125.2 kg)   BMI 50.48 kg/m   Constitutional: generally well-appearing, no acute distress Psychiatric: alert and oriented x3, cooperative Eyes: extraocular movements intact, anicteric, conjunctiva pink Mouth: oral pharynx moist, no lesions Neck: supple no lymphadenopathy Cardiovascular: heart regular rate and rhythm, no murmur Lungs: clear to auscultation bilaterally Abdomen: soft, obese, nontender, nondistended, no obvious ascites, no peritoneal signs, normal bowel sounds, no organomegaly Rectal: Omitted Extremities: no clubbing, cyanosis.  Trace lower extremity edema bilaterally Skin: no lesions on visible extremities Neuro: No focal deficits.  Cranial nerves intact  ASSESSMENT:  1.  GERD as manifested by pyrosis and postprandial burning discomfort.  Did not tolerate pantoprazole previously 2.  Morbid obesity with  BMI greater than 50   PLAN:  1.  Reflux precautions with attention to weight loss 2.  Literature on GERD reflux precautions provided 3.  Prescribe omeprazole 40 mg daily 5.  Okay to use antacids on demand for breakthrough symptoms 6.  GI office follow-up 6 weeks.  Contact the  office in the interim for any questions or problems A total time of 45 minutes was spent preparing to see the patient, reviewing a myriad of outside tests and x-rays, obtaining comprehensive history, performing medically appropriate physical examination, counseling and educating the patient regarding her above listed problems, and documenting clinical information in the health record.

## 2020-04-19 NOTE — Patient Instructions (Signed)
We have sent the following medications to your pharmacy for you to pick up at your convenience:  Omeprazole  Please follow up on ______________________________

## 2020-04-25 ENCOUNTER — Telehealth: Payer: Self-pay | Admitting: Internal Medicine

## 2020-04-25 DIAGNOSIS — K900 Celiac disease: Secondary | ICD-10-CM

## 2020-04-25 MED ORDER — OMEPRAZOLE 40 MG PO CPDR: 40.0000 mg | DELAYED_RELEASE_CAPSULE | Freq: Every day | ORAL | 3 refills | Status: DC

## 2020-04-25 NOTE — Telephone Encounter (Signed)
rx resent  

## 2020-04-25 NOTE — Telephone Encounter (Signed)
Inbound call from patient stating pharmacy accidentally erased her file from their and is requesting omeprazole script be resent to pharmacy please.

## 2020-06-14 ENCOUNTER — Ambulatory Visit: Payer: BC Managed Care – PPO | Admitting: Internal Medicine

## 2021-01-27 DIAGNOSIS — M25512 Pain in left shoulder: Secondary | ICD-10-CM | POA: Diagnosis not present

## 2021-01-27 DIAGNOSIS — H9202 Otalgia, left ear: Secondary | ICD-10-CM | POA: Diagnosis not present

## 2021-05-19 ENCOUNTER — Other Ambulatory Visit: Payer: Self-pay | Admitting: Internal Medicine

## 2021-05-30 ENCOUNTER — Emergency Department (HOSPITAL_BASED_OUTPATIENT_CLINIC_OR_DEPARTMENT_OTHER)
Admission: EM | Admit: 2021-05-30 | Discharge: 2021-05-30 | Disposition: A | Discharge status: Home / Self Care | Payer: Worker's Compensation | Attending: Emergency Medicine | Admitting: Emergency Medicine

## 2021-05-30 ENCOUNTER — Emergency Department (HOSPITAL_BASED_OUTPATIENT_CLINIC_OR_DEPARTMENT_OTHER): Payer: Worker's Compensation

## 2021-05-30 ENCOUNTER — Other Ambulatory Visit: Payer: Self-pay

## 2021-05-30 ENCOUNTER — Encounter (HOSPITAL_BASED_OUTPATIENT_CLINIC_OR_DEPARTMENT_OTHER): Payer: Self-pay | Admitting: *Deleted

## 2021-05-30 DIAGNOSIS — S79912A Unspecified injury of left hip, initial encounter: Secondary | ICD-10-CM | POA: Diagnosis not present

## 2021-05-30 DIAGNOSIS — Z79899 Other long term (current) drug therapy: Secondary | ICD-10-CM | POA: Diagnosis not present

## 2021-05-30 DIAGNOSIS — S72125A Nondisplaced fracture of lesser trochanter of left femur, initial encounter for closed fracture: Secondary | ICD-10-CM | POA: Diagnosis not present

## 2021-05-30 DIAGNOSIS — Y92410 Unspecified street and highway as the place of occurrence of the external cause: Secondary | ICD-10-CM | POA: Insufficient documentation

## 2021-05-30 DIAGNOSIS — S72102A Unspecified trochanteric fracture of left femur, initial encounter for closed fracture: Secondary | ICD-10-CM

## 2021-05-30 DIAGNOSIS — I10 Essential (primary) hypertension: Secondary | ICD-10-CM | POA: Insufficient documentation

## 2021-05-30 DIAGNOSIS — S72112A Displaced fracture of greater trochanter of left femur, initial encounter for closed fracture: Secondary | ICD-10-CM | POA: Diagnosis not present

## 2021-05-30 DIAGNOSIS — M1612 Unilateral primary osteoarthritis, left hip: Secondary | ICD-10-CM | POA: Diagnosis not present

## 2021-05-30 LAB — PREGNANCY, URINE: Preg Test, Ur: NEGATIVE

## 2021-05-30 NOTE — ED Notes (Signed)
Ice pack provided for pain and comfort ?
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
## 2021-05-30 NOTE — ED Notes (Signed)
Able to ambulate and place wt on LLE, points to trochanter on left, denies any back pain. Has strong plantar and dorsal flexion ?
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
## 2021-05-30 NOTE — ED Notes (Signed)
ED Provider at bedside. 

## 2021-06-09 ENCOUNTER — Ambulatory Visit (INDEPENDENT_AMBULATORY_CARE_PROVIDER_SITE_OTHER): Payer: BLUE CROSS/BLUE SHIELD | Admitting: Family Medicine

## 2021-06-09 ENCOUNTER — Encounter: Payer: Self-pay | Admitting: Family Medicine

## 2021-06-09 NOTE — Progress Notes (Signed)
error 

## 2021-06-23 DIAGNOSIS — M25512 Pain in left shoulder: Secondary | ICD-10-CM | POA: Diagnosis not present

## 2021-06-23 DIAGNOSIS — K219 Gastro-esophageal reflux disease without esophagitis: Secondary | ICD-10-CM | POA: Diagnosis not present

## 2021-06-23 DIAGNOSIS — Z136 Encounter for screening for cardiovascular disorders: Secondary | ICD-10-CM | POA: Diagnosis not present

## 2021-06-23 DIAGNOSIS — Z0001 Encounter for general adult medical examination with abnormal findings: Secondary | ICD-10-CM | POA: Diagnosis not present

## 2021-06-23 DIAGNOSIS — I10 Essential (primary) hypertension: Secondary | ICD-10-CM | POA: Diagnosis not present

## 2021-06-23 DIAGNOSIS — Z1329 Encounter for screening for other suspected endocrine disorder: Secondary | ICD-10-CM | POA: Diagnosis not present

## 2021-06-23 DIAGNOSIS — Z131 Encounter for screening for diabetes mellitus: Secondary | ICD-10-CM | POA: Diagnosis not present

## 2021-06-23 DIAGNOSIS — G8929 Other chronic pain: Secondary | ICD-10-CM | POA: Diagnosis not present

## 2021-06-25 ENCOUNTER — Ambulatory Visit (INDEPENDENT_AMBULATORY_CARE_PROVIDER_SITE_OTHER): Payer: Self-pay | Admitting: Family Medicine

## 2021-06-25 VITALS — BP 156/99 | Ht 62.0 in | Wt 272.0 lb

## 2021-06-25 DIAGNOSIS — G5702 Lesion of sciatic nerve, left lower limb: Secondary | ICD-10-CM

## 2021-06-25 MED ORDER — MELOXICAM 15 MG PO TABS: ORAL_TABLET | ORAL | 1 refills | Status: DC
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
## 2021-06-25 NOTE — Patient Instructions (Signed)
Nice to meet you ?Please try the exercises  ?We'll make a referral to physical therapy   ?Please send me a message in MyChart with any questions or updates.  ?Please see me back in 3 weeks.  ? ?--Dr. Raeford Razor ? ?
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
## 2021-07-02 DIAGNOSIS — M6281 Muscle weakness (generalized): Secondary | ICD-10-CM | POA: Diagnosis not present

## 2021-07-02 DIAGNOSIS — M25552 Pain in left hip: Secondary | ICD-10-CM | POA: Diagnosis not present

## 2021-07-03 DIAGNOSIS — E876 Hypokalemia: Secondary | ICD-10-CM | POA: Diagnosis not present

## 2021-07-03 DIAGNOSIS — M25561 Pain in right knee: Secondary | ICD-10-CM | POA: Diagnosis not present

## 2021-07-03 DIAGNOSIS — M25512 Pain in left shoulder: Secondary | ICD-10-CM | POA: Diagnosis not present

## 2021-07-03 DIAGNOSIS — G8929 Other chronic pain: Secondary | ICD-10-CM | POA: Diagnosis not present

## 2021-07-15 ENCOUNTER — Encounter: Payer: Self-pay | Admitting: Family Medicine

## 2021-07-15 ENCOUNTER — Ambulatory Visit (INDEPENDENT_AMBULATORY_CARE_PROVIDER_SITE_OTHER): Payer: BLUE CROSS/BLUE SHIELD | Admitting: Family Medicine

## 2021-07-15 VITALS — BP 130/88 | Ht 62.0 in | Wt 272.0 lb

## 2021-07-15 DIAGNOSIS — G5702 Lesion of sciatic nerve, left lower limb: Secondary | ICD-10-CM | POA: Diagnosis not present
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
## 2021-07-15 NOTE — Patient Instructions (Signed)
Good to see you Please work on the exercises at home   Please send me a message in [place] with any questions or updates.  Please see me back in 4 weeks.   --Dr. Raeford Razor

## 2021-07-16 ENCOUNTER — Ambulatory Visit: Payer: BLUE CROSS/BLUE SHIELD | Admitting: Family Medicine

## 2021-07-17 ENCOUNTER — Telehealth: Payer: Self-pay | Admitting: Family Medicine

## 2021-07-17 NOTE — Telephone Encounter (Signed)
Placed pt's Employer Physician statement /STD form on provider's desk --advised pt  we will be out of office till 07/28/21.  --glh

## 2021-07-23 ENCOUNTER — Telehealth: Payer: Self-pay | Admitting: Family Medicine

## 2021-07-23 NOTE — Telephone Encounter (Signed)
Patient called request med recs from all appts-- says she will pick up Monday 6/5.  Printing for pt w/signed [place]

## 2021-08-06 ENCOUNTER — Ambulatory Visit (INDEPENDENT_AMBULATORY_CARE_PROVIDER_SITE_OTHER): Payer: BLUE CROSS/BLUE SHIELD | Admitting: Family Medicine

## 2021-08-06 ENCOUNTER — Ambulatory Visit: Payer: Self-pay

## 2021-08-06 ENCOUNTER — Encounter: Payer: Self-pay | Admitting: Family Medicine

## 2021-08-06 VITALS — BP 130/98 | Ht 62.0 in | Wt 272.0 lb

## 2021-08-06 DIAGNOSIS — M25561 Pain in right knee: Secondary | ICD-10-CM

## 2021-08-06 DIAGNOSIS — M1711 Unilateral primary osteoarthritis, right knee: Secondary | ICD-10-CM | POA: Diagnosis not present

## 2021-08-06 DIAGNOSIS — M5412 Radiculopathy, cervical region: Secondary | ICD-10-CM | POA: Insufficient documentation

## 2021-08-06 NOTE — Assessment & Plan Note (Signed)
Acute on chronic in nature.  Continues to have pain after surgery from a few years ago. -Counseled on home exercise therapy and supportive care. -Pursue medial unloader brace due to thigh to calf ratio. -Could consider further imaging or Zilretta injection.

## 2021-08-06 NOTE — Progress Notes (Signed)
  Debra Carr - 41 y.o. female MRN [id number]  Date of birth: 03/10/1980  SUBJECTIVE:  Including CC & ROS.  No chief complaint on file.   Debra Carr is a 41 y.o. female that is presenting with acute on chronic right knee pain and acute left trapezius pain.  She has a history of knee surgery from 2020.  She had a medial meniscectomy and chondroplasty.  She continues to have pain in the medial compartment.  She is having neck pain with left-sided trapezius pain.  At times it radiates to the left shoulder.  Has gotten better since being out of work.   Review of Systems See HPI   HISTORY: Past Medical, Surgical, Social, and Family History Reviewed & Updated per EMR.   Pertinent Historical Findings include:  Past Medical History:  Diagnosis Date   Arthritis    Ectopic pregnancy    GERD (gastroesophageal reflux disease)    Hypertension    Thyroid disorder     Past Surgical History:  Procedure Laterality Date   CESAREAN SECTION N/A 08/06/2015   Procedure: CESAREAN SECTION;  Surgeon: Woodroe Mode, MD;  Location: [facility];  Service: Obstetrics;  Laterality: N/A;   LAPAROSCOPY     miniscus Right      PHYSICAL EXAM:  VS: BP (!) 130/98 (BP Location: Left Arm, Patient Position: Sitting)   Ht '5\' 2"'$  (1.575 m)   Wt 272 lb (123.4 kg)   BMI 49.75 kg/m  Physical Exam Gen: NAD, alert, cooperative with exam, well-appearing MSK:  Right knee: No effusion. Normal strength resistance. Instability to valgus and varus stress testing. Ambulatory on exam. Neurovascularly intact    Limited ultrasound: Right knee:  No effusion suprapatellar pouch. Normal-appearing quadricep and patellar tendon. Moderate medial joint space narrowing with degenerative changes of the meniscus. Normal-appearing lateral joint space  Summary: Degenerative changes of the medial compartment.  Ultrasound and interpretation by Clearance [name], MD    ASSESSMENT & PLAN:   Cervical  radiculopathy Acutely occurring.  Symptoms occurring more of the neck and trapezius which would suggest more of a radicular origin. -Counseled on home exercise therapy and supportive care. -Could consider further imaging or physical therapy.  Primary osteoarthritis of right knee Acute on chronic in nature.  Continues to have pain after surgery from a few years ago. -Counseled on home exercise therapy and supportive care. -Pursue medial unloader brace due to thigh to calf ratio. -Could consider further imaging or Zilretta injection.

## 2021-08-06 NOTE — Patient Instructions (Signed)
Good to see you Please try heat on the neck and ice on the knee. Please try the exercises. The [employer] representative will give you call to take the brace. Please send me a message in MyChart with any questions or updates.  Please see me back in 4 to 6 weeks.   --Dr. Raeford Razor

## 2021-08-06 NOTE — Assessment & Plan Note (Signed)
Acutely occurring.  Symptoms occurring more of the neck and trapezius which would suggest more of a radicular origin. -Counseled on home exercise therapy and supportive care. -Could consider further imaging or physical therapy.

## 2021-08-15 ENCOUNTER — Ambulatory Visit (INDEPENDENT_AMBULATORY_CARE_PROVIDER_SITE_OTHER): Payer: BLUE CROSS/BLUE SHIELD | Admitting: Family Medicine

## 2021-08-15 ENCOUNTER — Encounter: Payer: Self-pay | Admitting: Family Medicine

## 2021-08-15 DIAGNOSIS — G5702 Lesion of sciatic nerve, left lower limb: Secondary | ICD-10-CM | POA: Diagnosis not present
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
## 2021-08-18 DIAGNOSIS — M1711 Unilateral primary osteoarthritis, right knee: Secondary | ICD-10-CM | POA: Diagnosis not present

## 2021-08-21 ENCOUNTER — Encounter: Payer: Self-pay | Admitting: Family Medicine

## 2021-08-21 ENCOUNTER — Ambulatory Visit (INDEPENDENT_AMBULATORY_CARE_PROVIDER_SITE_OTHER): Payer: BLUE CROSS/BLUE SHIELD | Admitting: Family Medicine

## 2021-08-21 ENCOUNTER — Ambulatory Visit: Payer: Self-pay

## 2021-08-21 VITALS — Ht 62.0 in | Wt 272.0 lb

## 2021-08-21 DIAGNOSIS — Z6841 Body Mass Index (BMI) 40.0 and over, adult: Secondary | ICD-10-CM | POA: Diagnosis not present

## 2021-08-21 DIAGNOSIS — E669 Obesity, unspecified: Secondary | ICD-10-CM | POA: Insufficient documentation

## 2021-08-21 DIAGNOSIS — M1711 Unilateral primary osteoarthritis, right knee: Secondary | ICD-10-CM | POA: Diagnosis not present

## 2021-08-21 MED ORDER — TRIAMCINOLONE ACETONIDE 40 MG/ML IJ SUSP
40.0000 mg | Freq: Once | INTRAMUSCULAR | Status: AC
  Administered 2021-08-21: 40 mg via INTRA_ARTICULAR

## 2021-08-21 NOTE — Assessment & Plan Note (Signed)
Acutely worsening.  Does have degenerative changes and straight other modalities with limited improvement. -Counseled on home exercise therapy and supportive care. -Injection today. -Could consider physical therapy or Zilretta going forward.

## 2021-08-21 NOTE — Progress Notes (Signed)
  Debra Carr - 41 y.o. female MRN [id number]  Date of birth: 1980-02-25  SUBJECTIVE:  Including CC & ROS.  No chief complaint on file.   Debra Carr is a 41 y.o. female that is presenting with acute worsening of her right knee pain.  She is having swelling.  No injury inciting event.    Review of Systems See HPI   HISTORY: Past Medical, Surgical, Social, and Family History Reviewed & Updated per EMR.   Pertinent Historical Findings include:  Past Medical History:  Diagnosis Date   Arthritis    Ectopic pregnancy    GERD (gastroesophageal reflux disease)    Hypertension    Thyroid disorder     Past Surgical History:  Procedure Laterality Date   CESAREAN SECTION N/A 08/06/2015   Procedure: CESAREAN SECTION;  Surgeon: Woodroe Mode, MD;  Location: [place];  Service: Obstetrics;  Laterality: N/A;   LAPAROSCOPY     miniscus Right      PHYSICAL EXAM:  VS: Ht '5\' 2"'$  (1.575 m)   Wt 272 lb (123.4 kg)   BMI 49.75 kg/m  Physical Exam Gen: NAD, alert, cooperative with exam, well-appearing MSK:  Neurovascularly intact     Aspiration/Injection Procedure Note Debra Carr 01/06/1981  Procedure: Injection Indications: Right knee pain  Procedure Details Consent: Risks of procedure as well as the alternatives and risks of each were explained to the (patient/caregiver).  Consent for procedure obtained. Time Out: Verified patient identification, verified procedure, site/side was marked, verified correct patient position, special equipment/implants available, medications/allergies/relevent history reviewed, required imaging and test results available.  Performed.  The area was cleaned with iodine and alcohol swabs.    The right knee superior lateral suprapatellar pouch was injected using 3 cc of 1% lidocaine on a 22-gauge 1-1/2 inch needle.  The syringe was switched to mixture containing 1 cc's of 40 mg Kenalog and 4 cc's of 0.25% bupivacaine was injected.   Ultrasound was used. Images were obtained in long views showing the injection.     A sterile dressing was applied.  Patient did tolerate procedure well.     ASSESSMENT & PLAN:   Obesity Has tried several methods in the past to lose weight.  -Referral to medical weight management.  Primary osteoarthritis of right knee Acutely worsening.  Does have degenerative changes and straight other modalities with limited improvement. -Counseled on home exercise therapy and supportive care. -Injection today. -Could consider physical therapy or Zilretta going forward.

## 2021-08-21 NOTE — Assessment & Plan Note (Signed)
Has tried several methods in the past to lose weight.  -Referral to medical weight management.

## 2021-08-21 NOTE — Addendum Note (Signed)
Addended by: Cresenciano Lick on: 08/21/2021 01:25 PM   Modules accepted: Orders

## 2021-08-21 NOTE — Patient Instructions (Signed)
Good to see you Please use ice as needed  I have made a referral to the [facility] clinic   Please send me a message in [place] with any questions or updates.  Please see me back in 4-8 weeks.   --Dr. Raeford Razor

## 2021-09-03 ENCOUNTER — Ambulatory Visit: Payer: BLUE CROSS/BLUE SHIELD | Admitting: Family Medicine

## 2021-10-08 ENCOUNTER — Ambulatory Visit: Payer: BLUE CROSS/BLUE SHIELD | Admitting: Family Medicine

## 2021-10-13 ENCOUNTER — Ambulatory Visit (INDEPENDENT_AMBULATORY_CARE_PROVIDER_SITE_OTHER): Payer: BLUE CROSS/BLUE SHIELD | Admitting: Family Medicine

## 2021-10-13 ENCOUNTER — Ambulatory Visit (HOSPITAL_BASED_OUTPATIENT_CLINIC_OR_DEPARTMENT_OTHER)
Admission: RE | Admit: 2021-10-13 | Discharge: 2021-10-13 | Disposition: A | Discharge status: Home / Self Care | Payer: BLUE CROSS/BLUE SHIELD | Source: Ambulatory Visit | Attending: Family Medicine | Admitting: Family Medicine

## 2021-10-13 VITALS — Ht 62.0 in | Wt 272.0 lb

## 2021-10-13 DIAGNOSIS — Z6841 Body Mass Index (BMI) 40.0 and over, adult: Secondary | ICD-10-CM | POA: Diagnosis not present

## 2021-10-13 DIAGNOSIS — M1711 Unilateral primary osteoarthritis, right knee: Secondary | ICD-10-CM | POA: Insufficient documentation

## 2021-10-13 NOTE — Patient Instructions (Signed)
Good to see you We have made the referral to the weight management  I will call with the xray results.  We'll get the MRI at the [facility]. Please send me a message in MyChart with any questions or updates.  We will call you with the gel injection approval and scheduling. .   --Dr. Raeford Razor

## 2021-10-13 NOTE — Assessment & Plan Note (Signed)
Acute on chronic in nature.  She has tried several steroid injections with no relief.  Has been under greater than 6 weeks of physician directed home exercise therapy. -Counseled on home exercise therapy and supportive care. -X-ray. -Pursue gel injection. -MRI of the right knee to evaluate for chondral lesion and for presurgical planning.

## 2021-10-13 NOTE — Progress Notes (Signed)
  Debra Carr - 41 y.o. female MRN [id number]  Date of birth: [birth year]  SUBJECTIVE:  Including CC & ROS.  No chief complaint on file.   Debra Carr is a 41 y.o. female that is presenting with worsening right knee pain.  Knee injection performed in June offered no relief.  She continues to have right knee pain.  She underwent right knee arthroscopy for 5 years ago and continued to have pain after the surgery.  She would like more resources with weight management.    Review of Systems See HPI   HISTORY: Past Medical, Surgical, Social, and Family History Reviewed & Updated per EMR.   Pertinent Historical Findings include:  Past Medical History:  Diagnosis Date   Arthritis    Ectopic pregnancy    GERD (gastroesophageal reflux disease)    Hypertension    Thyroid disorder     Past Surgical History:  Procedure Laterality Date   CESAREAN SECTION N/A 08/06/2015   Procedure: CESAREAN SECTION;  Surgeon: Woodroe Mode, MD;  Location: [place];  Service: Obstetrics;  Laterality: N/A;   LAPAROSCOPY     miniscus Right      PHYSICAL EXAM:  VS: Ht 5' 2"  (1.575 m)   Wt 272 lb (123.4 kg)   BMI 49.75 kg/m  Physical Exam Gen: NAD, alert, cooperative with exam, well-appearing MSK:  Right knee: Effusion noted today. Limited range of motion. Weakness to resistance. Instability with valgus and varus stress testing. Neurovascularly intact       ASSESSMENT & PLAN:   Primary osteoarthritis of right knee Acute on chronic in nature.  She has tried several steroid injections with no relief.  Has been under greater than 6 weeks of physician directed home exercise therapy. -Counseled on home exercise therapy and supportive care. -X-ray. -Pursue gel injection. -MRI of the right knee to evaluate for chondral lesion and for presurgical planning.  Obesity Acute on chronic in nature.  Would like more resources and help with weight management. -Referral to weight  management.

## 2021-10-13 NOTE — Assessment & Plan Note (Signed)
Acute on chronic in nature.  Would like more resources and help with weight management. -Referral to weight management.

## 2021-10-15 ENCOUNTER — Telehealth: Payer: Self-pay | Admitting: Family Medicine

## 2021-10-15 NOTE — Telephone Encounter (Signed)
Informed on results.   Rosemarie Ax, MD [facility] 10/15/2021, 3:08 PM

## 2021-10-17 ENCOUNTER — Telehealth: Payer: Self-pay | Admitting: Family Medicine

## 2021-10-17 NOTE — Telephone Encounter (Signed)
Submitted request for monovisc/orthovisc to [employer] portal today.

## 2021-10-22 NOTE — Telephone Encounter (Signed)
Signed PA form and clinicals faxed to [facility].

## 2021-10-25 ENCOUNTER — Ambulatory Visit (INDEPENDENT_AMBULATORY_CARE_PROVIDER_SITE_OTHER): Payer: BLUE CROSS/BLUE SHIELD

## 2021-10-25 DIAGNOSIS — M1711 Unilateral primary osteoarthritis, right knee: Secondary | ICD-10-CM

## 2021-10-25 DIAGNOSIS — G8929 Other chronic pain: Secondary | ICD-10-CM | POA: Diagnosis not present

## 2021-10-25 DIAGNOSIS — M7121 Synovial cyst of popliteal space [Baker], right knee: Secondary | ICD-10-CM | POA: Diagnosis not present

## 2021-10-25 DIAGNOSIS — S83241A Other tear of medial meniscus, current injury, right knee, initial encounter: Secondary | ICD-10-CM | POA: Diagnosis not present

## 2021-10-29 ENCOUNTER — Telehealth (INDEPENDENT_AMBULATORY_CARE_PROVIDER_SITE_OTHER): Payer: BLUE CROSS/BLUE SHIELD | Admitting: Family Medicine

## 2021-10-29 ENCOUNTER — Encounter: Payer: Self-pay | Admitting: Family Medicine

## 2021-10-29 DIAGNOSIS — M1711 Unilateral primary osteoarthritis, right knee: Secondary | ICD-10-CM

## 2021-10-29 NOTE — Assessment & Plan Note (Signed)
Acute on chronic in nature.  MRI was demonstrating previous surgery to the knee.  Showing new changes within the trochlear notch. -Counseled on home exercise therapy and supportive care. -Continue with gel injection. -Could consider Zilretta.

## 2021-10-29 NOTE — Progress Notes (Signed)
Virtual Visit via Video Note  I connected with Debra Carr on 10/29/21 at  1:00 PM EDT by a video enabled telemedicine application and verified that I am speaking with the correct person using two identifiers.  Location: Patient: home Provider: office   I discussed the limitations of evaluation and management by telemedicine and the availability of in person appointments. The patient expressed understanding and agreed to proceed.  History of Present Illness:  Debra Carr is a 41 year old female that is following up after the MRI of her right knee.  This was showing partial medial meniscectomy with a high-grade attenuation of the medial meniscus.  There is patellofemoral compartment degenerative changes with a new thin full-thickness fissure within the trochlear notch.  Observations/Objective:   Assessment and Plan:  Osteoarthritis of right knee: Acute on chronic in nature.  MRI was demonstrating previous surgery to the knee.  Showing new changes within the trochlear notch. -Counseled on home exercise therapy and supportive care. -Continue with gel injection. -Could consider Zilretta.  Follow Up Instructions:    I discussed the assessment and treatment plan with the patient. The patient was provided an opportunity to ask questions and all were answered. The patient agreed with the plan and demonstrated an understanding of the instructions.   The patient was advised to call back or seek an in-person evaluation if the symptoms worsen or if the condition fails to improve as anticipated.   Clearance [name], MD

## 2021-10-31 NOTE — Telephone Encounter (Signed)
I called ins to check Monovisc PA status. They state they never received faxed PA form. PA initiated today. We will receive approval or denial by phone or fax.

## 2021-11-04 NOTE — Telephone Encounter (Signed)
PA is approved. OV scheduled 11/12/21. Patient's VOB states she has a 10% coinsurance. Per patient she has dual coverage and never pays OOP for anything. Monovisc ordered with drug rep.

## 2021-11-12 ENCOUNTER — Ambulatory Visit: Payer: Self-pay

## 2021-11-12 ENCOUNTER — Ambulatory Visit (INDEPENDENT_AMBULATORY_CARE_PROVIDER_SITE_OTHER): Payer: BLUE CROSS/BLUE SHIELD | Admitting: Family Medicine

## 2021-11-12 ENCOUNTER — Encounter: Payer: Self-pay | Admitting: Family Medicine

## 2021-11-12 VITALS — BP 125/83 | Ht 62.0 in | Wt 272.0 lb

## 2021-11-12 DIAGNOSIS — M1711 Unilateral primary osteoarthritis, right knee: Secondary | ICD-10-CM

## 2021-11-12 MED ORDER — HYALURONAN 88 MG/4ML IX SOSY
88.0000 mg | PREFILLED_SYRINGE | Freq: Once | INTRA_ARTICULAR | Status: AC
  Administered 2021-11-12: 88 mg via INTRA_ARTICULAR

## 2021-11-12 NOTE — Patient Instructions (Signed)
Good to see you Please try ice as needed   Please send me a message in MyChart with any questions or updates.  Please see me back in 4 weeks.   --Dr. Raeford Razor

## 2021-11-12 NOTE — Assessment & Plan Note (Signed)
Completed monovisc injection

## 2021-11-12 NOTE — Progress Notes (Signed)
  Debra Carr - 41 y.o. female MRN [id number]  Date of birth: 1980/03/24  SUBJECTIVE:  Including CC & ROS.  No chief complaint on file.   Debra Carr is a 41 y.o. female that is  here for a gel injection.    Review of Systems See HPI   HISTORY: Past Medical, Surgical, Social, and Family History Reviewed & Updated per EMR.   Pertinent Historical Findings include:  Past Medical History:  Diagnosis Date   Arthritis    Ectopic pregnancy    GERD (gastroesophageal reflux disease)    Hypertension    Thyroid disorder     Past Surgical History:  Procedure Laterality Date   CESAREAN SECTION N/A 08/06/2015   Procedure: CESAREAN SECTION;  Surgeon: Woodroe Mode, MD;  Location: [place];  Service: Obstetrics;  Laterality: N/A;   LAPAROSCOPY     miniscus Right      PHYSICAL EXAM:  VS: BP 125/83 (BP Location: Left Arm, Patient Position: Sitting)   Ht 5' 2"  (1.575 m)   Wt 272 lb (123.4 kg)   BMI 49.75 kg/m  Physical Exam Gen: NAD, alert, cooperative with exam, well-appearing MSK:  Neurovascularly intact     Aspiration/Injection Procedure Note Debra Carr 14-Dec-1980  Procedure: Injection Indications: right knee pain  Procedure Details Consent: Risks of procedure as well as the alternatives and risks of each were explained to the (patient/caregiver).  Consent for procedure obtained. Time Out: Verified patient identification, verified procedure, site/side was marked, verified correct patient position, special equipment/implants available, medications/allergies/relevent history reviewed, required imaging and test results available.  Performed.  The area was cleaned with iodine and alcohol swabs.    The right knee superior lateral suprapatellar pouch was injected using 4 cc's of 1% lidocaine with a 12 2" needle.  The syringe was switched and 4 mL of 22 mg/mL was injected. Ultrasound was used. Images were obtained in  Long views showing the injection.    A  sterile dressing was applied.  Patient did tolerate procedure well.    ASSESSMENT & PLAN:   Primary osteoarthritis of right knee Completed monovisc injection

## 2021-12-24 DIAGNOSIS — Z90710 Acquired absence of both cervix and uterus: Secondary | ICD-10-CM | POA: Diagnosis not present

## 2021-12-24 DIAGNOSIS — I10 Essential (primary) hypertension: Secondary | ICD-10-CM | POA: Diagnosis not present

## 2021-12-24 DIAGNOSIS — G8929 Other chronic pain: Secondary | ICD-10-CM | POA: Diagnosis not present

## 2021-12-24 DIAGNOSIS — Z01419 Encounter for gynecological examination (general) (routine) without abnormal findings: Secondary | ICD-10-CM | POA: Diagnosis not present

## 2021-12-24 DIAGNOSIS — Z8742 Personal history of other diseases of the female genital tract: Secondary | ICD-10-CM | POA: Diagnosis not present

## 2021-12-24 DIAGNOSIS — D649 Anemia, unspecified: Secondary | ICD-10-CM | POA: Diagnosis not present

## 2021-12-24 DIAGNOSIS — J028 Acute pharyngitis due to other specified organisms: Secondary | ICD-10-CM | POA: Diagnosis not present

## 2022-01-07 DIAGNOSIS — Z0000 Encounter for general adult medical examination without abnormal findings: Secondary | ICD-10-CM | POA: Diagnosis not present

## 2022-02-10 DIAGNOSIS — Z1231 Encounter for screening mammogram for malignant neoplasm of breast: Secondary | ICD-10-CM | POA: Diagnosis not present

## 2022-03-04 DIAGNOSIS — D649 Anemia, unspecified: Secondary | ICD-10-CM | POA: Diagnosis not present

## 2022-03-04 DIAGNOSIS — G8929 Other chronic pain: Secondary | ICD-10-CM | POA: Diagnosis not present

## 2022-03-04 DIAGNOSIS — I10 Essential (primary) hypertension: Secondary | ICD-10-CM | POA: Diagnosis not present

## 2022-03-25 DIAGNOSIS — R635 Abnormal weight gain: Secondary | ICD-10-CM | POA: Diagnosis not present

## 2022-03-25 DIAGNOSIS — G8929 Other chronic pain: Secondary | ICD-10-CM | POA: Diagnosis not present

## 2022-03-25 DIAGNOSIS — K5909 Other constipation: Secondary | ICD-10-CM | POA: Diagnosis not present

## 2022-04-15 DIAGNOSIS — G8929 Other chronic pain: Secondary | ICD-10-CM | POA: Diagnosis not present

## 2022-04-15 DIAGNOSIS — K5909 Other constipation: Secondary | ICD-10-CM | POA: Diagnosis not present

## 2022-06-08 ENCOUNTER — Encounter: Payer: Self-pay | Admitting: *Deleted

## 2022-06-10 DIAGNOSIS — K5900 Constipation, unspecified: Secondary | ICD-10-CM | POA: Diagnosis not present

## 2022-07-12 ENCOUNTER — Other Ambulatory Visit: Payer: Self-pay

## 2022-07-12 ENCOUNTER — Emergency Department (HOSPITAL_BASED_OUTPATIENT_CLINIC_OR_DEPARTMENT_OTHER)
Admission: EM | Admit: 2022-07-12 | Discharge: 2022-07-12 | Disposition: A | Discharge status: Home / Self Care | Payer: BLUE CROSS/BLUE SHIELD | Attending: Emergency Medicine | Admitting: Emergency Medicine

## 2022-07-12 ENCOUNTER — Encounter (HOSPITAL_BASED_OUTPATIENT_CLINIC_OR_DEPARTMENT_OTHER): Payer: Self-pay | Admitting: Emergency Medicine

## 2022-07-12 DIAGNOSIS — Z79899 Other long term (current) drug therapy: Secondary | ICD-10-CM | POA: Insufficient documentation

## 2022-07-12 DIAGNOSIS — U071 COVID-19: Secondary | ICD-10-CM | POA: Diagnosis not present

## 2022-07-12 DIAGNOSIS — I10 Essential (primary) hypertension: Secondary | ICD-10-CM | POA: Insufficient documentation

## 2022-07-12 DIAGNOSIS — R519 Headache, unspecified: Secondary | ICD-10-CM | POA: Diagnosis not present

## 2022-07-12 LAB — RESP PANEL BY RT-PCR (RSV, FLU A&B, COVID)  RVPGX2
Influenza A by PCR: NEGATIVE
Influenza B by PCR: NEGATIVE
Resp Syncytial Virus by PCR: NEGATIVE
SARS Coronavirus 2 by RT PCR: POSITIVE — AB

## 2022-07-12 NOTE — ED Provider Notes (Signed)
[place] [facility] Provider Note   CSN: [id number] Arrival date & time: 07/12/22  1420     History  Chief Complaint  Patient presents with   Headache    Debra Carr is a 42 y.o. female.  Patient presents to the emergency department for upper respiratory infection symptoms starting on 07/09/2022.  She was around her manager at work who was sick.  She states that she felt generally fatigued.  When the symptoms started she developed some diarrhea and cough and congestion.  She has also had associated headache.  She has had subjective fevers.  No ear pain or vomiting.  She denies history of any respiratory problems.  She is not a diabetic.  She does have hypertension and is medicated for this.  She has not taken any over-the-counter medications for her symptoms because she is afraid that there can interfere with her blood pressure medicine.       Home Medications Prior to Admission medications   Medication Sig Start Date End Date Taking? Authorizing Provider  atenolol-chlorthalidone (TENORETIC) 50-25 MG tablet Take 1 tablet by mouth daily.    [provider]      Allergies    Patient has no known allergies.    Review of Systems   Review of Systems  Physical Exam Updated Vital Signs BP (!) 158/96 (BP Location: Left Arm) Comment: pt states BP med non-complaince  Pulse 81   Temp 99.3 F (37.4 C) (Oral)   Resp 18   Ht 5\' 2"  (1.575 m)   Wt 111.1 kg   LMP 05/31/2019 (Approximate) Comment: (-) u preg//ac  SpO2 100%   BMI 44.81 kg/m  Physical Exam Vitals and nursing note reviewed.  Constitutional:      Appearance: She is well-developed.  HENT:     Head: Normocephalic and atraumatic.     Jaw: No trismus.     Right Ear: Tympanic membrane, ear canal and external ear normal.     Left Ear: Tympanic membrane, ear canal and external ear normal.     Nose: Congestion present. No mucosal edema or rhinorrhea.     Mouth/Throat:      Mouth: Mucous membranes are moist. Mucous membranes are not dry. No oral lesions.     Pharynx: Uvula midline. No oropharyngeal exudate, posterior oropharyngeal erythema or uvula swelling.     Tonsils: No tonsillar abscesses.  Eyes:     General:        Right eye: No discharge.        Left eye: No discharge.     Conjunctiva/sclera: Conjunctivae normal.  Neck:     Comments: No meningeal signs Cardiovascular:     Rate and Rhythm: Normal rate and regular rhythm.     Heart sounds: Normal heart sounds.  Pulmonary:     Effort: Pulmonary effort is normal. No respiratory distress.     Breath sounds: Normal breath sounds. No wheezing or rales.     Comments: Lungs clear to auscultation bilaterally. Abdominal:     Palpations: Abdomen is soft.     Tenderness: There is no abdominal tenderness.  Musculoskeletal:     Cervical back: Normal range of motion and neck supple.  Lymphadenopathy:     Cervical: No cervical adenopathy.  Skin:    General: Skin is warm and dry.  Neurological:     General: No focal deficit present.     Mental Status: She is alert. Mental status is at baseline.  Motor: No weakness.     Coordination: Coordination normal.     Gait: Gait normal.  Psychiatric:        Mood and Affect: Mood normal.     ED Results / Procedures / Treatments   Labs (all labs ordered are listed, but only abnormal results are displayed) Labs Reviewed  RESP PANEL BY RT-PCR (RSV, FLU A&B, COVID)  [id number] - Abnormal; Notable for the following components:      Result Value   SARS Coronavirus 2 by RT PCR POSITIVE (*)    All other components within normal limits    EKG None  Radiology No results found.  Procedures Procedures    Medications Ordered in ED Medications - No data to display  ED Course/ Medical Decision Making/ A&P    Patient seen and examined. History obtained directly from patient. Work-up including labs, imaging, EKG ordered in triage, if performed, were reviewed.     Labs/EKG: Independently reviewed and interpreted.  This included: COVID-positive, flu negative.  Imaging: None ordered  Medications/Fluids: None ordered  Most recent vital signs reviewed and are as follows: BP (!) 158/96 (BP Location: Left Arm) Comment: pt states BP med non-complaince  Pulse 81   Temp 99.3 F (37.4 C) (Oral)   Resp 18   Ht 5\' 2"  (1.575 m)   Wt 111.1 kg   LMP 05/31/2019 (Approximate) Comment: (-) u preg//ac  SpO2 100%   BMI 44.81 kg/m   Initial impression: COVID-19  Detailed discussion had with with patient regarding COVID-19 precautions and written instructions given as well.  We discussed need to isolate themselves for 5 days from onset of symptoms and have 24 hours of improvement prior to breaking isolation.  We discussed that when breaking isolation, mask wearing for 5 additional days is required.  We discussed signs symptoms to return which include worsening shortness of breath, trouble breathing, or increased work of breathing.  Also return with persistent vomiting, confusion, passing out, or if they have any other concerns. Counseled on the need for rest and good hydration. Discussed that high-risk contacts should be aware of positive result and they need to quarantine and be tested if they develop any symptoms. Patient verbalizes understanding.                               Medical Decision Making  Patient with URI symptoms, cough and congestion, some diarrhea and headache.  She tested positive for COVID today.  No history of immunocompromise or pulmonary disease.  She does have elevated BMI.  Symptoms currently are minor.  Do not feel strongly that she requires antiviral today.  She appears well, nontoxic.  Low concern for pneumonia.  She does have a headache but appears well without signs of meningismus.         Final Clinical Impression(s) / ED Diagnoses Final diagnoses:  COVID-19    Rx / DC Orders ED Discharge Orders     None          Renne Crigler, PA-C 07/12/22 1618    Rondel Baton, MD 07/13/22 [phone]

## 2022-07-12 NOTE — Discharge Instructions (Signed)
Please read and follow all provided instructions.  Your diagnoses today include:  1. COVID-19     Tests performed today include: Vital signs. See below for your results today.  COVID test - positive COVID test  Medications prescribed:  Please use over-the-counter NSAID medications (ibuprofen, naproxen) or Tylenol (acetaminophen) as directed on the packaging for pain -- as long as you do not have any reasons avoid these medications. Reasons to avoid NSAID medications include: weak kidneys, a history of bleeding in your stomach or gut, or uncontrolled high blood pressure or previous heart attack. Reasons to avoid Tylenol include: liver problems or ongoing alcohol use. Never take more than 4000mg  or 8 Extra strength Tylenol in a 24 hour period.     Take any prescribed medications only as directed. Treatment for your infection is aimed at treating the symptoms. There are no medications, such as antibiotics, that will cure your infection.   Home care instructions:  Follow any educational materials contained in this packet.   Your illness is contagious and can be spread to others, especially during the first 3 or 4 days. It cannot be cured by antibiotics or other medicines. Take basic precautions such as washing your hands often, covering your mouth when you cough or sneeze, and avoiding public places where you could spread your illness to others.   Please continue drinking plenty of fluids.  Use over-the-counter medicines as needed as directed on packaging for symptom relief.  You may also use ibuprofen or tylenol as directed on packaging for pain or fever.  Do not take multiple medicines containing Tylenol or acetaminophen to avoid taking too much of this medication.  If you are positive for Covid-19, you should isolate yourself and not be exposed to other people for 5 days after your symptoms began. If you are not feeling better at day 5, you need to isolate yourself for a total of 10 days. If you  are feeling better by day 5, you should wear a mask properly, over your nose and mouth, at all times while around other people until 10 days after your symptoms started.   Follow-up instructions: Please follow-up with your primary care provider as needed for further evaluation of your symptoms if you are not feeling better.   Return instructions:  Please return to the Emergency Department if you experience worsening symptoms.  Return to the emergency department if you have worsening shortness of breath breathing or increased work of breathing, persistent vomiting RETURN IMMEDIATELY IF you develop shortness of breath, confusion or altered mental status, a new rash, become dizzy, faint, or poorly responsive, or are unable to be cared for at home. Please return if you have persistent vomiting and cannot keep down fluids or develop a fever that is not controlled by tylenol or motrin.   Please return if you have any other emergent concerns.  Additional Information:  Your vital signs today were: BP (!) 158/96 (BP Location: Left Arm) Comment: pt states BP med non-complaince  Pulse 81   Temp 99.3 F (37.4 C) (Oral)   Resp 18   Ht 5\' 2"  (1.575 m)   Wt 111.1 kg   LMP 05/31/2019 (Approximate) Comment: (-) u preg//ac  SpO2 100%   BMI 44.81 kg/m  If your blood pressure (BP) was elevated above 135/85 this visit, please have this repeated by your doctor within one month. --------------

## 2022-07-12 NOTE — ED Triage Notes (Signed)
Pt c/o cough, congestion, HA since Thurs

## 2022-09-30 DIAGNOSIS — K5909 Other constipation: Secondary | ICD-10-CM | POA: Diagnosis not present

## 2022-09-30 DIAGNOSIS — G8929 Other chronic pain: Secondary | ICD-10-CM | POA: Diagnosis not present

## 2022-09-30 DIAGNOSIS — R519 Headache, unspecified: Secondary | ICD-10-CM | POA: Diagnosis not present

## 2022-09-30 DIAGNOSIS — R635 Abnormal weight gain: Secondary | ICD-10-CM | POA: Diagnosis not present

## 2022-09-30 DIAGNOSIS — I10 Essential (primary) hypertension: Secondary | ICD-10-CM | POA: Diagnosis not present

## 2022-10-28 DIAGNOSIS — I10 Essential (primary) hypertension: Secondary | ICD-10-CM | POA: Diagnosis not present

## 2022-10-28 DIAGNOSIS — K5909 Other constipation: Secondary | ICD-10-CM | POA: Diagnosis not present

## 2022-11-25 DIAGNOSIS — K5909 Other constipation: Secondary | ICD-10-CM | POA: Diagnosis not present

## 2022-11-25 DIAGNOSIS — Z83719 Family history of colon polyps, unspecified: Secondary | ICD-10-CM | POA: Diagnosis not present

## 2022-12-02 DIAGNOSIS — K648 Other hemorrhoids: Secondary | ICD-10-CM | POA: Diagnosis not present

## 2022-12-02 DIAGNOSIS — R109 Unspecified abdominal pain: Secondary | ICD-10-CM | POA: Diagnosis not present

## 2022-12-02 DIAGNOSIS — K5909 Other constipation: Secondary | ICD-10-CM | POA: Diagnosis not present

## 2022-12-14 DIAGNOSIS — L84 Corns and callosities: Secondary | ICD-10-CM | POA: Diagnosis not present

## 2023-02-03 DIAGNOSIS — J028 Acute pharyngitis due to other specified organisms: Secondary | ICD-10-CM | POA: Diagnosis not present

## 2023-02-19 ENCOUNTER — Emergency Department (HOSPITAL_BASED_OUTPATIENT_CLINIC_OR_DEPARTMENT_OTHER): Payer: BLUE CROSS/BLUE SHIELD

## 2023-02-19 ENCOUNTER — Other Ambulatory Visit: Payer: Self-pay

## 2023-02-19 ENCOUNTER — Emergency Department (HOSPITAL_BASED_OUTPATIENT_CLINIC_OR_DEPARTMENT_OTHER)
Admission: EM | Admit: 2023-02-19 | Discharge: 2023-02-19 | Disposition: A | Discharge status: Home / Self Care | Payer: BLUE CROSS/BLUE SHIELD | Attending: Emergency Medicine | Admitting: Emergency Medicine

## 2023-02-19 ENCOUNTER — Encounter (HOSPITAL_BASED_OUTPATIENT_CLINIC_OR_DEPARTMENT_OTHER): Payer: Self-pay

## 2023-02-19 DIAGNOSIS — M19012 Primary osteoarthritis, left shoulder: Secondary | ICD-10-CM | POA: Diagnosis not present

## 2023-02-19 DIAGNOSIS — M50222 Other cervical disc displacement at C5-C6 level: Secondary | ICD-10-CM | POA: Diagnosis not present

## 2023-02-19 DIAGNOSIS — M5412 Radiculopathy, cervical region: Secondary | ICD-10-CM | POA: Insufficient documentation

## 2023-02-19 DIAGNOSIS — M25512 Pain in left shoulder: Secondary | ICD-10-CM | POA: Diagnosis not present

## 2023-02-19 DIAGNOSIS — I10 Essential (primary) hypertension: Secondary | ICD-10-CM | POA: Diagnosis not present

## 2023-02-19 DIAGNOSIS — M5023 Other cervical disc displacement, cervicothoracic region: Secondary | ICD-10-CM | POA: Diagnosis not present

## 2023-02-19 DIAGNOSIS — M4312 Spondylolisthesis, cervical region: Secondary | ICD-10-CM | POA: Diagnosis not present

## 2023-02-19 DIAGNOSIS — M542 Cervicalgia: Secondary | ICD-10-CM | POA: Diagnosis not present

## 2023-02-19 DIAGNOSIS — E041 Nontoxic single thyroid nodule: Secondary | ICD-10-CM | POA: Diagnosis not present

## 2023-02-19 MED ORDER — DEXAMETHASONE SODIUM PHOSPHATE 10 MG/ML IJ SOLN
10.0000 mg | Freq: Once | INTRAMUSCULAR | Status: AC
  Administered 2023-02-19: 10 mg via INTRAMUSCULAR
  Filled 2023-02-19: qty 1

## 2023-02-19 MED ORDER — KETOROLAC TROMETHAMINE 30 MG/ML IJ SOLN
30.0000 mg | Freq: Once | INTRAMUSCULAR | Status: AC
  Administered 2023-02-19: 30 mg via INTRAMUSCULAR
  Filled 2023-02-19: qty 1

## 2023-02-19 MED ORDER — DICLOFENAC SODIUM 1 % EX GEL: 2.0000 g | Freq: Four times a day (QID) | CUTANEOUS | 0 refills | Status: AC | PRN

## 2023-02-19 MED ORDER — METHOCARBAMOL 500 MG PO TABS: 500.0000 mg | ORAL_TABLET | Freq: Three times a day (TID) | ORAL | 0 refills | Status: AC | PRN

## 2023-02-19 MED ORDER — MELOXICAM 15 MG PO TABS: 15.0000 mg | ORAL_TABLET | Freq: Every day | ORAL | 0 refills | Status: AC

## 2023-02-19 NOTE — ED Provider Notes (Signed)
Emergency Department Provider Note   I have reviewed the triage vital signs and the nursing notes.   HISTORY  Chief Complaint Shoulder Pain   HPI Debra Carr is a 42 y.o. female with past history reviewed below presents emergency department for evaluation of left shoulder and neck pain.  Symptoms began 4 days prior without known injury.  She feels a sharp/shooting pains over the left shoulder and radiating to the neck.  No weakness or numbness in the upper extremities.  No severe pain with range of motion of the left shoulder, elbow, wrist.  No falls. No similar symptoms in the past.    Past Medical History:  Diagnosis Date   Arthritis    Ectopic pregnancy    GERD (gastroesophageal reflux disease)    Hypertension    Thyroid disorder     Review of Systems  Constitutional: No fever/chills Cardiovascular: Denies chest pain. Respiratory: Denies shortness of breath. Gastrointestinal: No abdominal pain.   Musculoskeletal: Positive neck and left shoulder pain.  Skin: Negative for rash. Neurological: Negative for headaches, focal weakness or numbness.  ____________________________________________   PHYSICAL EXAM:  VITAL SIGNS: ED Triage Vitals  Encounter Vitals Group     BP 02/19/23 0732 121/78     Pulse Rate 02/19/23 0732 (!) 56     Resp 02/19/23 0732 16     Temp 02/19/23 0732 98.1 F (36.7 C)     Temp Source 02/19/23 0732 Oral     SpO2 02/19/23 0732 100 %     Weight 02/19/23 0732 240 lb (108.9 kg)   Constitutional: Alert and oriented. Well appearing and in no acute distress. Eyes: Conjunctivae are normal. Head: Atraumatic. Nose: No congestion/rhinnorhea. Mouth/Throat: Mucous membranes are moist. Neck: No stridor.  No cervical spine tenderness to palpation. Cardiovascular: Normal rate, regular rhythm. Good peripheral circulation. Grossly normal heart sounds.   Respiratory: Normal respiratory effort.   Gastrointestinal: No distention.  Musculoskeletal:  Normal ROM of the left shoulder without joint erythema or large effusion.  Neurologic:  Normal speech and language. No gross focal neurologic deficits are appreciated.  Skin:  Skin is warm, dry and intact. No rash noted.  ____________________________________________  RADIOLOGY  DG Shoulder Left Result Date: 02/19/2023 CLINICAL DATA:  42 year old female with pain radiating to the left shoulder for 4 days. Neck pain radiating to the left ear. EXAM: LEFT SHOULDER - 2+ VIEW COMPARISON:  Cervical spine CT today.  Chest radiographs 05/24/2017. FINDINGS: Bone mineralization is within normal limits. No glenohumeral joint dislocation. Intact proximal left humerus, left clavicle and scapula. Moderate joint space loss and mild to moderate degenerative spurring at the left [facility] joint. Negative visible left ribs and chest. IMPRESSION: Up to moderate for age left [facility] joint degeneration. No acute osseous abnormality identified. Electronically Signed   By: Odessa Fleming M.D.   On: 02/19/2023 08:25   CT Cervical Spine Wo Contrast Result Date: 02/19/2023 CLINICAL DATA:  42 year old female with pain radiating to the left shoulder for 4 days. Neck pain radiating to the left ear. EXAM: CT CERVICAL SPINE WITHOUT CONTRAST TECHNIQUE: Multidetector CT imaging of the cervical spine was performed without intravenous contrast. Multiplanar CT image reconstructions were also generated. RADIATION DOSE REDUCTION: This exam was performed according to the departmental dose-optimization program which includes automated exposure control, adjustment of the mA and/or kV according to patient size and/or use of iterative reconstruction technique. COMPARISON:  Head CT 03/21/2014. FINDINGS: Alignment: Straightening of cervical lordosis. Cervicothoracic junction alignment is within normal limits. Bilateral posterior  element alignment is within normal limits. No spondylolisthesis or scoliosis. Skull base and vertebrae: Bone mineralization is within  normal limits. Visualized skull base is intact. No atlanto-occipital dissociation. No acute osseous abnormality identified. Soft tissues and spinal canal: No prevertebral fluid or swelling. No visible canal hematoma. Right thyroid rim calcified up to 19 mm upper pole nodule coronal image 56. Otherwise negative visible noncontrast neck soft tissues. Disc levels: C2-C3:  Negative. C3-C4:  Negative. C4-C5: Subtle anterolisthesis. Mostly mild disc bulging and endplate spurring. No evidence of stenosis. C5-C6: Disc space loss with mostly far lateral disc bulging and endplate spurring. No convincing stenosis. C6-C7:  Minor disc space loss. No stenosis. C7-T1: Mild disc space loss and disc bulging. No significant stenosis. Upper chest: Visible upper thoracic levels appear intact, negative. Negative upper lungs, noncontrast superior mediastinum. Other: Cervicomedullary junction is within normal limits. Negative visible noncontrast posterior fossa, brain parenchyma. Visible tympanic cavities, mastoids, paranasal sinuses are well aerated. IMPRESSION: 1. No acute osseous abnormality and mild cervical spine degeneration with no CT evidence of spinal stenosis or neural impingement. 2. Right thyroid 19 mm nodule. Recommend non-emergent thyroid ultrasound. Reference: J Am Coll Radiol. 2015 Feb;12(2): 143-50 Electronically Signed   By: Odessa Fleming M.D.   On: 02/19/2023 08:24    ____________________________________________   PROCEDURES  Procedure(s) performed:   Procedures  None  ____________________________________________   INITIAL IMPRESSION / ASSESSMENT AND PLAN / ED COURSE  Pertinent labs & imaging results that were available during my care of the patient were reviewed by me and considered in my medical decision making (see chart for details).   This patient is Presenting for Evaluation of shoulder/neck pain, which does require a range of treatment options, and is a complaint that involves a high risk of  morbidity and mortality.  The Differential Diagnoses includes but is not exclusive to musculoskeletal back pain, radicular pain, OA, septic joint, etc.   Critical Interventions-    Medications  ketorolac (TORADOL) 30 MG/ML injection 30 mg (30 mg Intramuscular Given 02/19/23 0823)  dexamethasone (DECADRON) injection 10 mg (10 mg Intramuscular Given 02/19/23 0853)    Reassessment after intervention:  pain improved.  Radiologic Tests Ordered, included CT c spine and shoulder XR. I independently interpreted the images and agree with radiology interpretation.   Medical Decision Making: Summary:  Presents emergency department with left shoulder and neck pain.  Symptoms, as described, seem radicular in nature.  No weakness/numbness on exam to prompt MRI but will obtain screening CT scan of the C-spine along with x-ray of the left shoulder.  The left shoulder exam shows normal range of motion.  No concern for septic joint clinically.   Reevaluation with update and discussion with patient. Discussed imaging and thyroid findings. She will discuss further with PCP. Suspect radicular pain. Plan for steroid burst, meloxicam, and MSK relaxer.   Patient's presentation is most consistent with acute presentation with potential threat to life or bodily function.   Disposition: discharge  ____________________________________________  FINAL CLINICAL IMPRESSION(S) / ED DIAGNOSES  Final diagnoses:  Cervical radiculopathy  Acute pain of left shoulder  Thyroid nodule     NEW OUTPATIENT MEDICATIONS STARTED DURING THIS VISIT:  Discharge Medication List as of 02/19/2023  8:47 AM     START taking these medications   Details  diclofenac Sodium (VOLTAREN) 1 % GEL Apply 2 g topically 4 (four) times daily as needed., Starting Fri 02/19/2023, Normal    meloxicam (MOBIC) 15 MG tablet Take 1 tablet (15 mg total)  by mouth daily for 7 days., Starting Fri 02/19/2023, Until Fri 02/26/2023, Normal     methocarbamol (ROBAXIN) 500 MG tablet Take 1 tablet (500 mg total) by mouth every 8 (eight) hours as needed., Starting Fri 02/19/2023, Normal        Note:  This document was prepared using Dragon voice recognition software and may include unintentional dictation errors.  Alona Bene, MD, [facility] Emergency Medicine    Jayse Hodkinson, Arlyss Repress, MD 02/19/23 [phone]

## 2023-02-19 NOTE — Discharge Instructions (Signed)
You were seen in the emerged from today with pain in your neck and left shoulder.  I suspect this may be coming from a pinched nerve in your neck and I am starting you on a single dose of steroid here which will last for the next couple of days.  I have also sent you home on meloxicam which is similar to ibuprofen.  You can take additional Tylenol as needed for pain but I would have you not use Motrin, aspirin, naproxen while taking the meloxicam.  You may use topical medications as you wish and I have called 1 of those to the pharmacy as well.  Robaxin is a muscle relaxer and you can take it at night or if you will not be driving as it can cause drowsiness.  Please keep your follow-up appointment with your primary care doctor next week to discuss your symptoms.  They can consider further referrals at that time as needed.  Please also discuss your thyroid nodule which was an incidental finding on your CT scan today.  They may consider arranging an outpatient ultrasound for you.

## 2023-02-19 NOTE — ED Triage Notes (Signed)
Pt reports left shoulder pain since Monday. Denies injury. Sharp pain radiates to neck and left ear

## 2023-03-03 DIAGNOSIS — Z0000 Encounter for general adult medical examination without abnormal findings: Secondary | ICD-10-CM | POA: Diagnosis not present

## 2023-03-03 DIAGNOSIS — E041 Nontoxic single thyroid nodule: Secondary | ICD-10-CM | POA: Diagnosis not present

## 2023-03-03 DIAGNOSIS — E010 Iodine-deficiency related diffuse (endemic) goiter: Secondary | ICD-10-CM | POA: Diagnosis not present

## 2023-03-17 DIAGNOSIS — I10 Essential (primary) hypertension: Secondary | ICD-10-CM | POA: Diagnosis not present

## 2023-03-17 DIAGNOSIS — E010 Iodine-deficiency related diffuse (endemic) goiter: Secondary | ICD-10-CM | POA: Diagnosis not present

## 2023-03-17 DIAGNOSIS — E041 Nontoxic single thyroid nodule: Secondary | ICD-10-CM | POA: Diagnosis not present

## 2023-03-24 DIAGNOSIS — E041 Nontoxic single thyroid nodule: Secondary | ICD-10-CM | POA: Diagnosis not present

## 2023-03-24 DIAGNOSIS — Z1231 Encounter for screening mammogram for malignant neoplasm of breast: Secondary | ICD-10-CM | POA: Diagnosis not present

## 2023-03-24 DIAGNOSIS — E010 Iodine-deficiency related diffuse (endemic) goiter: Secondary | ICD-10-CM | POA: Diagnosis not present

## 2023-04-07 DIAGNOSIS — E041 Nontoxic single thyroid nodule: Secondary | ICD-10-CM | POA: Diagnosis not present

## 2023-04-07 DIAGNOSIS — I10 Essential (primary) hypertension: Secondary | ICD-10-CM | POA: Diagnosis not present

## 2023-04-07 DIAGNOSIS — E010 Iodine-deficiency related diffuse (endemic) goiter: Secondary | ICD-10-CM | POA: Diagnosis not present

## 2023-04-19 DIAGNOSIS — E041 Nontoxic single thyroid nodule: Secondary | ICD-10-CM | POA: Diagnosis not present

## 2023-04-29 DIAGNOSIS — E041 Nontoxic single thyroid nodule: Secondary | ICD-10-CM | POA: Diagnosis not present

## 2023-04-30 DIAGNOSIS — E041 Nontoxic single thyroid nodule: Secondary | ICD-10-CM | POA: Diagnosis not present

## 2023-05-12 DIAGNOSIS — R058 Other specified cough: Secondary | ICD-10-CM | POA: Diagnosis not present

## 2023-05-12 DIAGNOSIS — I10 Essential (primary) hypertension: Secondary | ICD-10-CM | POA: Diagnosis not present

## 2023-05-12 DIAGNOSIS — Z9189 Other specified personal risk factors, not elsewhere classified: Secondary | ICD-10-CM | POA: Diagnosis not present

## 2023-06-23 DIAGNOSIS — Z9189 Other specified personal risk factors, not elsewhere classified: Secondary | ICD-10-CM | POA: Diagnosis not present

## 2023-06-23 DIAGNOSIS — R42 Dizziness and giddiness: Secondary | ICD-10-CM | POA: Diagnosis not present

## 2023-06-23 DIAGNOSIS — I10 Essential (primary) hypertension: Secondary | ICD-10-CM | POA: Diagnosis not present

## 2023-08-11 DIAGNOSIS — Z9189 Other specified personal risk factors, not elsewhere classified: Secondary | ICD-10-CM | POA: Diagnosis not present

## 2023-08-11 DIAGNOSIS — I10 Essential (primary) hypertension: Secondary | ICD-10-CM | POA: Diagnosis not present
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
## 2023-12-15 DIAGNOSIS — M549 Dorsalgia, unspecified: Secondary | ICD-10-CM | POA: Diagnosis not present
